# Patient Record
Sex: Female | Born: 1995 | Race: White | Hispanic: No | Marital: Single | Smoking: Former smoker
Health system: Southern US, Community
[De-identification: ages and names within clinical notes are randomized; demographics above are authoritative.]

## PROBLEM LIST (undated history)

## (undated) DIAGNOSIS — Z789 Other specified health status: Secondary | ICD-10-CM

## (undated) HISTORY — PX: NO PAST SURGERIES: SHX2092

---
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
## 2017-04-24 ENCOUNTER — Encounter (HOSPITAL_COMMUNITY): Payer: Self-pay | Admitting: Emergency Medicine

## 2017-04-24 ENCOUNTER — Emergency Department (HOSPITAL_COMMUNITY)
Admission: EM | Admit: 2017-04-24 | Discharge: 2017-04-24 | Disposition: A | Discharge status: Home / Self Care | Payer: Medicaid Other | Attending: Emergency Medicine | Admitting: Emergency Medicine

## 2017-04-24 DIAGNOSIS — O26859 Spotting complicating pregnancy, unspecified trimester: Secondary | ICD-10-CM | POA: Diagnosis not present

## 2017-04-24 DIAGNOSIS — Z5321 Procedure and treatment not carried out due to patient leaving prior to being seen by health care provider: Secondary | ICD-10-CM | POA: Diagnosis not present

## 2017-04-24 DIAGNOSIS — Z3A00 Weeks of gestation of pregnancy not specified: Secondary | ICD-10-CM | POA: Insufficient documentation

## 2017-04-24 LAB — BASIC METABOLIC PANEL
ANION GAP: 6 (ref 5–15)
BUN: 15 mg/dL (ref 6–20)
CO2: 27 mmol/L (ref 22–32)
Calcium: 8.9 mg/dL (ref 8.9–10.3)
Chloride: 105 mmol/L (ref 101–111)
Creatinine, Ser: 0.71 mg/dL (ref 0.44–1.00)
GFR calc Af Amer: >60 mL/min (ref >60)
GLUCOSE: 92 mg/dL (ref 65–99)
POTASSIUM: 4.1 mmol/L (ref 3.5–5.1)
Sodium: 138 mmol/L (ref 135–145)

## 2017-04-24 LAB — HCG, QUANTITATIVE, PREGNANCY: HCG, BETA CHAIN, QUANT, S: 9 m[IU]/mL — AB (ref <5)

## 2017-04-24 LAB — CBC WITH DIFFERENTIAL/PLATELET
Basophils Absolute: 0 10*3/uL (ref 0.0–0.1)
Basophils Relative: 0 %
EOS PCT: 4 %
Eosinophils Absolute: 0.3 10*3/uL (ref 0.0–0.7)
HCT: 40 % (ref 36.0–46.0)
HEMOGLOBIN: 13.3 g/dL (ref 12.0–15.0)
LYMPHS ABS: 2.6 10*3/uL (ref 0.7–4.0)
LYMPHS PCT: 37 %
MCH: 30.2 pg (ref 26.0–34.0)
MCHC: 33.3 g/dL (ref 30.0–36.0)
MCV: 90.9 fL (ref 78.0–100.0)
Monocytes Absolute: 0.5 10*3/uL (ref 0.1–1.0)
Monocytes Relative: 8 %
NEUTROS ABS: 3.5 10*3/uL (ref 1.7–7.7)
NEUTROS PCT: 51 %
PLATELETS: 296 10*3/uL (ref 150–400)
RBC: 4.4 MIL/uL (ref 3.87–5.11)
RDW: 12.6 % (ref 11.5–15.5)
WBC: 6.9 10*3/uL (ref 4.0–10.5)

## 2017-04-24 NOTE — ED Triage Notes (Signed)
Patient took a pregnancy test Saturday that was positive and started with vaginal bleeding on Sunday. States she went to [facility] yesterday and was told she is "most likely having a miscarriage."

## 2017-10-31 NOTE — L&D Delivery Note (Signed)
Delivery Note At  a viable female was delivered via  (Presentation:vertex ; LOA ).  APGAR:9 , 9; weight  .   Placenta status: spont,shultz .  Cord:3vc  with the following complications:none .  Cord pH: n/a  Anesthesia: epidural  Episiotomy:  none Lacerations:  none Suture Repair: n/a Est. Blood Loss 200(mL):    Mom to postpartum.  Baby to Couplet care / Skin to Skin.  Koren Shiver 07/01/2018, 9:21 AM

## 2017-12-04 ENCOUNTER — Ambulatory Visit (INDEPENDENT_AMBULATORY_CARE_PROVIDER_SITE_OTHER): Payer: Medicaid Other | Admitting: Adult Health

## 2017-12-04 ENCOUNTER — Encounter: Payer: Self-pay | Admitting: Adult Health

## 2017-12-04 VITALS — BP 90/60 | HR 61 | Ht 64.0 in | Wt 93.5 lb

## 2017-12-04 DIAGNOSIS — N926 Irregular menstruation, unspecified: Secondary | ICD-10-CM

## 2017-12-04 DIAGNOSIS — O3680X0 Pregnancy with inconclusive fetal viability, not applicable or unspecified: Secondary | ICD-10-CM | POA: Insufficient documentation

## 2017-12-04 DIAGNOSIS — Z3201 Encounter for pregnancy test, result positive: Secondary | ICD-10-CM

## 2017-12-04 DIAGNOSIS — R109 Unspecified abdominal pain: Secondary | ICD-10-CM | POA: Diagnosis not present

## 2017-12-04 DIAGNOSIS — Z3A08 8 weeks gestation of pregnancy: Secondary | ICD-10-CM

## 2017-12-04 LAB — POCT URINE PREGNANCY: PREG TEST UR: POSITIVE — AB

## 2017-12-04 NOTE — Progress Notes (Signed)
Subjective:     Patient ID: Elizabeth Bird, female   DOB: 1995/11/28, 22 y.o.   MRN: [id number]  HPI Elizabeth Bird is a 22 year old white female in for UPT, has missed a period and had 2+HPTs.Has some cramping.She had miscarriage in February 2017(about 14 weeks, in size), blood type A+.  Review of Systems +missed period with 2+HPTs +cramps, no bleeding Reviewed past medical,surgical, social and family history. Reviewed medications and allergies.     Objective:   Physical Exam BP 90/60 (BP Location: Left Arm, Patient Position: Sitting, Cuff Size: Normal)   Pulse 61   Ht 5\' 4"  (1.626 m)   Wt 93 lb 8 oz (42.4 kg)   LMP 10/08/2017 (Approximate)   BMI 16.05 kg/m UPT +, about 8+1 week by LMP with EDD 07/15/18.Skin warm and dry. Neck: mid line trachea, normal thyroid, good ROM, no lymphadenopathy noted. Lungs: clear to ausculation bilaterally. Cardiovascular: regular rate and rhythm.Abdomen is soft and non tender.PHQ 2 score 0. Discussed that babies delivered at [facility] and about after hours call service.     Assessment:     1. Pregnancy examination or test, positive result   2. [redacted] weeks gestation of pregnancy   3. Encounter to determine fetal viability of pregnancy, single or unspecified fetus       Plan:     Continue PNV Return in about 1 week for dating [place] Review handouts on First trimester and by [facility]  Request records from [facility]

## 2017-12-04 NOTE — Patient Instructions (Signed)
First Trimester of Pregnancy The first trimester of pregnancy is from week 1 until the end of week 13 (months 1 through 3). A week after a sperm fertilizes an egg, the egg will implant on the wall of the uterus. This embryo will begin to develop into a baby. Genes from you and your partner will form the baby. The female genes will determine whether the baby will be a boy or a girl. At 6-8 weeks, the eyes and face will be formed, and the heartbeat can be seen on ultrasound. At the end of 12 weeks, all the baby's organs will be formed. Now that you are pregnant, you will want to do everything you can to have a healthy baby. Two of the most important things are to get good prenatal care and to follow your health care provider's instructions. Prenatal care is all the medical care you receive before the baby's birth. This care will help prevent, find, and treat any problems during the pregnancy and childbirth. Body changes during your first trimester Your body goes through many changes during pregnancy. The changes vary from woman to woman.  You may gain or lose a couple of pounds at first.  You may feel sick to your stomach (nauseous) and you may throw up (vomit). If the vomiting is uncontrollable, call your health care provider.  You may tire easily.  You may develop headaches that can be relieved by medicines. All medicines should be approved by your health care provider.  You may urinate more often. Painful urination may mean you have a bladder infection.  You may develop heartburn as a result of your pregnancy.  You may develop constipation because certain hormones are causing the muscles that push stool through your intestines to slow down.  You may develop hemorrhoids or swollen veins (varicose veins).  Your breasts may begin to grow larger and become tender. Your nipples may stick out more, and the tissue that surrounds them (areola) may become darker.  Your gums may bleed and may be  sensitive to brushing and flossing.  Dark spots or blotches (chloasma, mask of pregnancy) may develop on your face. This will likely fade after the baby is born.  Your menstrual periods will stop.  You may have a loss of appetite.  You may develop cravings for certain kinds of food.  You may have changes in your emotions from day to day, such as being excited to be pregnant or being concerned that something may go wrong with the pregnancy and baby.  You may have more vivid and strange dreams.  You may have changes in your hair. These can include thickening of your hair, rapid growth, and changes in texture. Some women also have hair loss during or after pregnancy, or hair that feels dry or thin. Your hair will most likely return to normal after your baby is born.  What to expect at prenatal visits During a routine prenatal visit:  You will be weighed to make sure you and the baby are growing normally.  Your blood pressure will be taken.  Your abdomen will be measured to track your baby's growth.  The fetal heartbeat will be listened to between weeks 10 and 14 of your pregnancy.  Test results from any previous visits will be discussed.  Your health care provider may ask you:  How you are feeling.  If you are feeling the baby move.  If you have had any abnormal symptoms, such as leaking fluid, bleeding, severe headaches,   or abdominal cramping.  If you are using any tobacco products, including cigarettes, chewing tobacco, and electronic cigarettes.  If you have any questions.  Other tests that may be performed during your first trimester include:  Blood tests to find your blood type and to check for the presence of any previous infections. The tests will also be used to check for low iron levels (anemia) and protein on red blood cells (Rh antibodies). Depending on your risk factors, or if you previously had diabetes during pregnancy, you may have tests to check for high blood  sugar that affects pregnant women (gestational diabetes).  Urine tests to check for infections, diabetes, or protein in the urine.  An ultrasound to confirm the proper growth and development of the baby.  Fetal screens for spinal cord problems (spina bifida) and Down syndrome.  HIV (human immunodeficiency virus) testing. Routine prenatal testing includes screening for HIV, unless you choose not to have this test.  You may need other tests to make sure you and the baby are doing well.  Follow these instructions at home: Medicines  Follow your health care provider's instructions regarding medicine use. Specific medicines may be either safe or unsafe to take during pregnancy.  Take a prenatal vitamin that contains at least 600 micrograms (mcg) of folic acid.  If you develop constipation, try taking a stool softener if your health care provider approves. Eating and drinking  Eat a balanced diet that includes fresh fruits and vegetables, whole grains, good sources of protein such as meat, eggs, or tofu, and low-fat dairy. Your health care provider will help you determine the amount of weight gain that is right for you.  Avoid raw meat and uncooked cheese. These carry germs that can cause birth defects in the baby.  Eating four or five small meals rather than three large meals a day may help relieve nausea and vomiting. If you start to feel nauseous, eating a few soda crackers can be helpful. Drinking liquids between meals, instead of during meals, also seems to help ease nausea and vomiting.  Limit foods that are high in fat and processed sugars, such as fried and sweet foods.  To prevent constipation: ? Eat foods that are high in fiber, such as fresh fruits and vegetables, whole grains, and beans. ? Drink enough fluid to keep your urine clear or pale yellow. Activity  Exercise only as directed by your health care provider. Most women can continue their usual exercise routine during  pregnancy. Try to exercise for 30 minutes at least 5 days a week. Exercising will help you: ? Control your weight. ? Stay in shape. ? Be prepared for labor and delivery.  Experiencing pain or cramping in the lower abdomen or lower back is a good sign that you should stop exercising. Check with your health care provider before continuing with normal exercises.  Try to avoid standing for long periods of time. Move your legs often if you must stand in one place for a long time.  Avoid heavy lifting.  Wear low-heeled shoes and practice good posture.  You may continue to have sex unless your health care provider tells you not to. Relieving pain and discomfort  Wear a good support bra to relieve breast tenderness.  Take warm sitz baths to soothe any pain or discomfort caused by hemorrhoids. Use hemorrhoid cream if your health care provider approves.  Rest with your legs elevated if you have leg cramps or low back pain.  If you develop   varicose veins in your legs, wear support hose. Elevate your feet for 15 minutes, 3-4 times a day. Limit salt in your diet. Prenatal care  Schedule your prenatal visits by the twelfth week of pregnancy. They are usually scheduled monthly at first, then more often in the last 2 months before delivery.  Write down your questions. Take them to your prenatal visits.  Keep all your prenatal visits as told by your health care provider. This is important. Safety  Wear your seat belt at all times when driving.  Make a list of emergency phone numbers, including numbers for family, friends, the hospital, and police and fire departments. General instructions  Ask your health care provider for a referral to a local prenatal education class. Begin classes no later than the beginning of month 6 of your pregnancy.  Ask for help if you have counseling or nutritional needs during pregnancy. Your health care provider can offer advice or refer you to specialists for help  with various needs.  Do not use hot tubs, steam rooms, or saunas.  Do not douche or use tampons or scented sanitary pads.  Do not cross your legs for long periods of time.  Avoid cat litter boxes and soil used by cats. These carry germs that can cause birth defects in the baby and possibly loss of the fetus by miscarriage or stillbirth.  Avoid all smoking, herbs, alcohol, and medicines not prescribed by your health care provider. Chemicals in these products affect the formation and growth of the baby.  Do not use any products that contain nicotine or tobacco, such as cigarettes and e-cigarettes. If you need help quitting, ask your health care provider. You may receive counseling support and other resources to help you quit.  Schedule a dentist appointment. At home, brush your teeth with a soft toothbrush and be gentle when you floss. Contact a health care provider if:  You have dizziness.  You have mild pelvic cramps, pelvic pressure, or nagging pain in the abdominal area.  You have persistent nausea, vomiting, or diarrhea.  You have a bad smelling vaginal discharge.  You have pain when you urinate.  You notice increased swelling in your face, hands, legs, or ankles.  You are exposed to fifth disease or chickenpox.  You are exposed to [place] measles (rubella) and have never had it. Get help right away if:  You have a fever.  You are leaking fluid from your vagina.  You have spotting or bleeding from your vagina.  You have severe abdominal cramping or pain.  You have rapid weight gain or loss.  You vomit blood or material that looks like coffee grounds.  You develop a severe headache.  You have shortness of breath.  You have any kind of trauma, such as from a fall or a car accident. Summary  The first trimester of pregnancy is from week 1 until the end of week 13 (months 1 through 3).  Your body goes through many changes during pregnancy. The changes vary from  woman to woman.  You will have routine prenatal visits. During those visits, your health care provider will examine you, discuss any test results you may have, and talk with you about how you are feeling. This information is not intended to replace advice given to you by your health care provider. Make sure you discuss any questions you have with your health care provider. Document Released: 10/11/2001 Document Revised: 09/28/2016 Document Reviewed: 09/28/2016 [employer] Interactive Patient Education  2018 [employer]  

## 2017-12-07 ENCOUNTER — Ambulatory Visit (INDEPENDENT_AMBULATORY_CARE_PROVIDER_SITE_OTHER): Payer: Medicaid Other

## 2017-12-07 ENCOUNTER — Other Ambulatory Visit: Payer: Self-pay | Admitting: Adult Health

## 2017-12-07 DIAGNOSIS — N83202 Unspecified ovarian cyst, left side: Secondary | ICD-10-CM

## 2017-12-07 DIAGNOSIS — O3481 Maternal care for other abnormalities of pelvic organs, first trimester: Secondary | ICD-10-CM | POA: Diagnosis not present

## 2017-12-07 DIAGNOSIS — N83292 Other ovarian cyst, left side: Secondary | ICD-10-CM

## 2017-12-07 DIAGNOSIS — O3680X0 Pregnancy with inconclusive fetal viability, not applicable or unspecified: Secondary | ICD-10-CM

## 2017-12-07 DIAGNOSIS — Z3A08 8 weeks gestation of pregnancy: Secondary | ICD-10-CM | POA: Diagnosis not present

## 2017-12-07 NOTE — Progress Notes (Signed)
[place] 8+4 wks,single IUP w/ys,positive fht 180 bpm,normal right ovary,simple left ovarian cyst 5.8 x 3.5 x 3.1 cm w/arterial and venous flow

## 2017-12-20 ENCOUNTER — Encounter: Payer: Self-pay | Admitting: Women's Health

## 2017-12-20 ENCOUNTER — Ambulatory Visit (INDEPENDENT_AMBULATORY_CARE_PROVIDER_SITE_OTHER): Payer: Medicaid Other | Admitting: Women's Health

## 2017-12-20 ENCOUNTER — Ambulatory Visit: Payer: Medicaid Other | Admitting: *Deleted

## 2017-12-20 VITALS — BP 90/50 | HR 56 | Wt 95.0 lb

## 2017-12-20 DIAGNOSIS — Z3682 Encounter for antenatal screening for nuchal translucency: Secondary | ICD-10-CM | POA: Diagnosis not present

## 2017-12-20 DIAGNOSIS — R636 Underweight: Secondary | ICD-10-CM

## 2017-12-20 DIAGNOSIS — O09291 Supervision of pregnancy with other poor reproductive or obstetric history, first trimester: Secondary | ICD-10-CM | POA: Diagnosis not present

## 2017-12-20 DIAGNOSIS — O9989 Other specified diseases and conditions complicating pregnancy, childbirth and the puerperium: Secondary | ICD-10-CM

## 2017-12-20 DIAGNOSIS — Z3A10 10 weeks gestation of pregnancy: Secondary | ICD-10-CM

## 2017-12-20 DIAGNOSIS — Z1389 Encounter for screening for other disorder: Secondary | ICD-10-CM

## 2017-12-20 DIAGNOSIS — Z8759 Personal history of other complications of pregnancy, childbirth and the puerperium: Secondary | ICD-10-CM | POA: Insufficient documentation

## 2017-12-20 DIAGNOSIS — Z3490 Encounter for supervision of normal pregnancy, unspecified, unspecified trimester: Secondary | ICD-10-CM | POA: Insufficient documentation

## 2017-12-20 DIAGNOSIS — Z3481 Encounter for supervision of other normal pregnancy, first trimester: Secondary | ICD-10-CM

## 2017-12-20 DIAGNOSIS — Z331 Pregnant state, incidental: Secondary | ICD-10-CM

## 2017-12-20 LAB — POCT URINALYSIS DIPSTICK
Blood, UA: NEGATIVE
Glucose, UA: NEGATIVE
KETONES UA: NEGATIVE
Leukocytes, UA: NEGATIVE
NITRITE UA: NEGATIVE

## 2017-12-20 NOTE — Patient Instructions (Signed)
Elizabeth Bird, I greatly value your feedback.  If you receive a survey following your visit with [place] today, we appreciate you taking the time to fill it out.  Thanks, Knute Neu, CNM, WHNP-BC   Nausea & Vomiting  Have saltine crackers or pretzels by your bed and eat a few bites before you raise your head out of bed in the morning  Eat small frequent meals throughout the day instead of large meals  Drink plenty of fluids throughout the day to stay hydrated, just don't drink a lot of fluids with your meals.  This can make your stomach fill up faster making you feel sick  Do not brush your teeth right after you eat  Products with real ginger are good for nausea, like ginger ale and ginger hard candy Make sure it says made with real ginger!  Sucking on sour candy like lemon heads is also good for nausea  If your prenatal vitamins make you nauseated, take them at night so you will sleep through the nausea  Sea Bands  If you feel like you need medicine for the nausea & vomiting please let us know  If you are unable to keep any fluids or food down please let us know   Constipation  Drink plenty of fluid, preferably water, throughout the day  Eat foods high in fiber such as fruits, vegetables, and grains  Exercise, such as walking, is a good way to keep your bowels regular  Drink warm fluids, especially warm prune juice, or decaf coffee  Eat a 1/2 cup of real oatmeal (not instant), 1/2 cup applesauce, and 1/2-1 cup warm prune juice every day  If needed, you may take Colace (docusate sodium) stool softener once or twice a day to help keep the stool soft. If you are pregnant, wait until you are out of your first trimester (12-14 weeks of pregnancy)  If you still are having problems with constipation, you may take Miralax once daily as needed to help keep your bowels regular.  If you are pregnant, wait until you are out of your first trimester (12-14 weeks of pregnancy)   First  Trimester of Pregnancy The first trimester of pregnancy is from week 1 until the end of week 12 (months 1 through 3). A week after a sperm fertilizes an egg, the egg will implant on the wall of the uterus. This embryo will begin to develop into a baby. Genes from you and your partner are forming the baby. The female genes determine whether the baby is a boy or a girl. At 6-8 weeks, the eyes and face are formed, and the heartbeat can be seen on ultrasound. At the end of 12 weeks, all the baby's organs are formed.  Now that you are pregnant, you will want to do everything you can to have a healthy baby. Two of the most important things are to get good prenatal care and to follow your health care provider's instructions. Prenatal care is all the medical care you receive before the baby's birth. This care will help prevent, find, and treat any problems during the pregnancy and childbirth. BODY CHANGES Your body goes through many changes during pregnancy. The changes vary from woman to woman.   You may gain or lose a couple of pounds at first.  You may feel sick to your stomach (nauseous) and throw up (vomit). If the vomiting is uncontrollable, call your health care provider.  You may tire easily.  You may develop headaches that  can be relieved by medicines approved by your health care provider.  You may urinate more often. Painful urination may mean you have a bladder infection.  You may develop heartburn as a result of your pregnancy.  You may develop constipation because certain hormones are causing the muscles that push waste through your intestines to slow down.  You may develop hemorrhoids or swollen, bulging veins (varicose veins).  Your breasts may begin to grow larger and become tender. Your nipples may stick out more, and the tissue that surrounds them (areola) may become darker.  Your gums may bleed and may be sensitive to brushing and flossing.  Dark spots or blotches (chloasma, mask  of pregnancy) may develop on your face. This will likely fade after the baby is born.  Your menstrual periods will stop.  You may have a loss of appetite.  You may develop cravings for certain kinds of food.  You may have changes in your emotions from day to day, such as being excited to be pregnant or being concerned that something may go wrong with the pregnancy and baby.  You may have more vivid and strange dreams.  You may have changes in your hair. These can include thickening of your hair, rapid growth, and changes in texture. Some women also have hair loss during or after pregnancy, or hair that feels dry or thin. Your hair will most likely return to normal after your baby is born. WHAT TO EXPECT AT YOUR PRENATAL VISITS During a routine prenatal visit:  You will be weighed to make sure you and the baby are growing normally.  Your blood pressure will be taken.  Your abdomen will be measured to track your baby's growth.  The fetal heartbeat will be listened to starting around week 10 or 12 of your pregnancy.  Test results from any previous visits will be discussed. Your health care provider may ask you:  How you are feeling.  If you are feeling the baby move.  If you have had any abnormal symptoms, such as leaking fluid, bleeding, severe headaches, or abdominal cramping.  If you have any questions. Other tests that may be performed during your first trimester include:  Blood tests to find your blood type and to check for the presence of any previous infections. They will also be used to check for low iron levels (anemia) and Rh antibodies. Later in the pregnancy, blood tests for diabetes will be done along with other tests if problems develop.  Urine tests to check for infections, diabetes, or protein in the urine.  An ultrasound to confirm the proper growth and development of the baby.  An amniocentesis to check for possible genetic problems.  Fetal screens for spina  bifida and Down syndrome.  You may need other tests to make sure you and the baby are doing well. HOME CARE INSTRUCTIONS  Medicines  Follow your health care provider's instructions regarding medicine use. Specific medicines may be either safe or unsafe to take during pregnancy.  Take your prenatal vitamins as directed.  If you develop constipation, try taking a stool softener if your health care provider approves. Diet  Eat regular, well-balanced meals. Choose a variety of foods, such as meat or vegetable-based protein, fish, milk and low-fat dairy products, vegetables, fruits, and whole grain breads and cereals. Your health care provider will help you determine the amount of weight gain that is right for you.  Avoid raw meat and uncooked cheese. These carry germs that can cause  birth defects in the baby.  Eating four or five small meals rather than three large meals a day may help relieve nausea and vomiting. If you start to feel nauseous, eating a few soda crackers can be helpful. Drinking liquids between meals instead of during meals also seems to help nausea and vomiting.  If you develop constipation, eat more high-fiber foods, such as fresh vegetables or fruit and whole grains. Drink enough fluids to keep your urine clear or pale yellow. Activity and Exercise  Exercise only as directed by your health care provider. Exercising will help you:  Control your weight.  Stay in shape.  Be prepared for labor and delivery.  Experiencing pain or cramping in the lower abdomen or low back is a good sign that you should stop exercising. Check with your health care provider before continuing normal exercises.  Try to avoid standing for long periods of time. Move your legs often if you must stand in one place for a long time.  Avoid heavy lifting.  Wear low-heeled shoes, and practice good posture.  You may continue to have sex unless your health care provider directs you  otherwise. Relief of Pain or Discomfort  Wear a good support bra for breast tenderness.   Take warm sitz baths to soothe any pain or discomfort caused by hemorrhoids. Use hemorrhoid cream if your health care provider approves.   Rest with your legs elevated if you have leg cramps or low back pain.  If you develop varicose veins in your legs, wear support hose. Elevate your feet for 15 minutes, 3-4 times a day. Limit salt in your diet. Prenatal Care  Schedule your prenatal visits by the twelfth week of pregnancy. They are usually scheduled monthly at first, then more often in the last 2 months before delivery.  Write down your questions. Take them to your prenatal visits.  Keep all your prenatal visits as directed by your health care provider. Safety  Wear your seat belt at all times when driving.  Make a list of emergency phone numbers, including numbers for family, friends, the hospital, and police and fire departments. General Tips  Ask your health care provider for a referral to a local prenatal education class. Begin classes no later than at the beginning of month 6 of your pregnancy.  Ask for help if you have counseling or nutritional needs during pregnancy. Your health care provider can offer advice or refer you to specialists for help with various needs.  Do not use hot tubs, steam rooms, or saunas.  Do not douche or use tampons or scented sanitary pads.  Do not cross your legs for long periods of time.  Avoid cat litter boxes and soil used by cats. These carry germs that can cause birth defects in the baby and possibly loss of the fetus by miscarriage or stillbirth.  Avoid all smoking, herbs, alcohol, and medicines not prescribed by your health care provider. Chemicals in these affect the formation and growth of the baby.  Schedule a dentist appointment. At home, brush your teeth with a soft toothbrush and be gentle when you floss. SEEK MEDICAL CARE IF:   You have  dizziness.  You have mild pelvic cramps, pelvic pressure, or nagging pain in the abdominal area.  You have persistent nausea, vomiting, or diarrhea.  You have a bad smelling vaginal discharge.  You have pain with urination.  You notice increased swelling in your face, hands, legs, or ankles. SEEK IMMEDIATE MEDICAL CARE IF:  You have a fever.  You are leaking fluid from your vagina.  You have spotting or bleeding from your vagina.  You have severe abdominal cramping or pain.  You have rapid weight gain or loss.  You vomit blood or material that looks like coffee grounds.  You are exposed to [place] measles and have never had them.  You are exposed to fifth disease or chickenpox.  You develop a severe headache.  You have shortness of breath.  You have any kind of trauma, such as from a fall or a car accident. Document Released: 10/11/2001 Document Revised: 03/03/2014 Document Reviewed: 08/27/2013 [facility] Patient Information 2015 [place], [place]. This information is not intended to replace advice given to you by your health care provider. Make sure you discuss any questions you have with your health care provider.

## 2017-12-20 NOTE — Progress Notes (Signed)
INITIAL OBSTETRICAL VISIT Patient name: Cristian Grieves MRN [id number]  Date of birth: 1995/12/17 Chief Complaint:   Initial Prenatal Visit  History of Present Illness:   Ghalia Reicks is a 22 y.o. G32P0020 Caucasian female at [redacted]w[redacted]d by LMP c/w 9wk u/s, with an Estimated Date of Delivery: 07/15/18 being seen today for her initial obstetrical visit.   Her obstetrical history is significant for early SAB x 1, then 16wk SAB- went to [facility] ED w/ abd pain dx w/ UTI had 'normal u/s', then went to [facility] for 2nd opinion-felt LOF while there, was transferred to [facility] and u/s revealed anhydramnios w/ head through cx, delivered shortly thereafter.  Placenta pathology revealed acute chorionitis.  Today she reports mild n/v- declines meds.  Patient's last menstrual period was 10/08/2017 (approximate). Last pap <21yo. Results were: n/a Review of Systems:   Pertinent items are noted in HPI Denies cramping/contractions, leakage of fluid, vaginal bleeding, abnormal vaginal discharge w/ itching/odor/irritation, headaches, visual changes, shortness of breath, chest pain, abdominal pain, severe nausea/vomiting, or problems with urination or bowel movements unless otherwise stated above.  Pertinent History Reviewed:  Reviewed past medical,surgical, social, obstetrical and family history.  Reviewed problem list, medications and allergies. OB History  Gravida Para Term Preterm AB Living  3 1     1  0  SAB TAB Ectopic Multiple Live Births  1     0      # Outcome Date GA Lbr Len/2nd Weight Sex Delivery Anes PTL Lv  3 Current           2 Para 12/06/15 [redacted]w[redacted]d  3.4 oz (0.096 kg) F Vag-Spont None  FD     Complications: Preterm premature rupture of membranes  1 SAB              Physical Assessment:   Vitals:   12/20/17 1502  BP: (!) 90/50  Pulse: (!) 56  Weight: 95 lb (43.1 kg)  Body mass index is 16.31 kg/m.       Physical Examination:  General appearance - well appearing, and in no distress  Mental status -  alert, oriented to person, place, and time  Psych:  She has a normal mood and affect  Skin - warm and dry, normal color, no suspicious lesions noted  Chest - effort normal, all lung fields clear to auscultation bilaterally  Heart - normal rate and regular rhythm  Abdomen - soft, nontender  Extremities:  No swelling or varicosities noted  Thin prep pap is not done  Fetal Heart Rate (bpm): 166 via doppler  Results for orders placed or performed in visit on 12/20/17 (from the past 24 hour(s))  POCT urinalysis dipstick   Collection Time: 12/20/17  3:02 PM  Result Value Ref Range   Color, UA     Clarity, UA     Glucose, UA neg    Bilirubin, UA     Ketones, UA neg    Spec Grav, UA  1.010 - 1.025   Blood, UA neg    pH, UA  5.0 - 8.0   Protein, UA 1+    Urobilinogen, UA  0.2 or 1.0 E.U./dL   Nitrite, UA neg    Leukocytes, UA Negative Negative   Appearance     Odor      Assessment & Plan:  1) Low-Risk Pregnancy G3P0010 at [redacted]w[redacted]d with an Estimated Date of Delivery: 07/15/18   2) Initial OB visit  3) H/O 16wk loss> d/t labor/ROM/acute chorionitis  4) Underweight> pre-gravid  BMI 15.3  Meds: No orders of the defined types were placed in this encounter.   Initial labs obtained Continue prenatal vitamins Reviewed n/v relief measures and warning s/s to report Reviewed recommended weight gain based on pre-gravid BMI Encouraged well-balanced diet Genetic Screening discussed Integrated Screen: requested Cystic fibrosis screening discussed requested Ultrasound discussed; fetal survey: requested CCNC completed>PCM not here  Follow-up: Return in about 3 weeks (around 01/08/2018) for LROB, US:NT+1stIT.   Orders Placed This Encounter  Procedures  . GC/Chlamydia Probe Amp  . Urine Culture  . US Fetal Nuchal Translucency Measurement  . Obstetric Panel, Including HIV  . Urinalysis, Routine w reflex microscopic  . Pain Management Screening Profile (10S)  . Cystic Fibrosis Mutation 97    . POCT urinalysis dipstick    Roma Schanz CNM, [facility] 12/20/2017 3:45 PM

## 2017-12-21 LAB — GC/CHLAMYDIA PROBE AMP
Chlamydia trachomatis, NAA: NEGATIVE
Neisseria gonorrhoeae by PCR: NEGATIVE

## 2017-12-21 LAB — PMP SCREEN PROFILE (10S), URINE
Amphetamine Scrn, Ur: NEGATIVE ng/mL
BARBITURATE SCREEN URINE: NEGATIVE ng/mL
BENZODIAZEPINE SCREEN, URINE: NEGATIVE ng/mL
CANNABINOIDS UR QL SCN: NEGATIVE ng/mL
COCAINE(METAB.)SCREEN, URINE: NEGATIVE ng/mL
CREATININE(CRT), U: 308.6 mg/dL — AB (ref 20.0–300.0)
Methadone Screen, Urine: NEGATIVE ng/mL
OPIATE SCREEN URINE: NEGATIVE ng/mL
OXYCODONE+OXYMORPHONE UR QL SCN: NEGATIVE ng/mL
Ph of Urine: 5.4 (ref 4.5–8.9)
Phencyclidine Qn, Ur: NEGATIVE ng/mL
Propoxyphene Scrn, Ur: NEGATIVE ng/mL

## 2017-12-21 LAB — URINALYSIS, ROUTINE W REFLEX MICROSCOPIC
BILIRUBIN UA: NEGATIVE
GLUCOSE, UA: NEGATIVE
Nitrite, UA: NEGATIVE
RBC UA: NEGATIVE
Urobilinogen, Ur: 1 mg/dL (ref 0.2–1.0)
pH, UA: 5.5 (ref 5.0–7.5)

## 2017-12-21 LAB — MICROSCOPIC EXAMINATION
BACTERIA UA: NONE SEEN
Casts: NONE SEEN /lpf

## 2017-12-21 LAB — OBSTETRIC PANEL, INCLUDING HIV
ANTIBODY SCREEN: NEGATIVE
BASOS ABS: 0 10*3/uL (ref 0.0–0.2)
BASOS: 0 %
EOS (ABSOLUTE): 0.2 10*3/uL (ref 0.0–0.4)
Eos: 2 %
HEMATOCRIT: 36.1 % (ref 34.0–46.6)
HIV SCREEN 4TH GENERATION: NONREACTIVE
Hemoglobin: 12.1 g/dL (ref 11.1–15.9)
Hepatitis B Surface Ag: NEGATIVE
Immature Grans (Abs): 0 10*3/uL (ref 0.0–0.1)
Immature Granulocytes: 0 %
LYMPHS ABS: 2.9 10*3/uL (ref 0.7–3.1)
Lymphs: 25 %
MCH: 30 pg (ref 26.6–33.0)
MCHC: 33.5 g/dL (ref 31.5–35.7)
MCV: 90 fL (ref 79–97)
MONOCYTES: 8 %
Monocytes Absolute: 0.9 10*3/uL (ref 0.1–0.9)
NEUTROS ABS: 7.6 10*3/uL — AB (ref 1.4–7.0)
Neutrophils: 65 %
PLATELETS: 280 10*3/uL (ref 150–379)
RBC: 4.03 x10E6/uL (ref 3.77–5.28)
RDW: 13.7 % (ref 12.3–15.4)
RPR Ser Ql: NONREACTIVE
RUBELLA: 4.24 {index} (ref >0.99)
Rh Factor: POSITIVE
WBC: 11.6 10*3/uL — ABNORMAL HIGH (ref 3.4–10.8)

## 2017-12-22 LAB — URINE CULTURE: Organism ID, Bacteria: NO GROWTH

## 2017-12-27 LAB — CYSTIC FIBROSIS MUTATION 97: Interpretation: NOT DETECTED

## 2018-01-10 ENCOUNTER — Other Ambulatory Visit: Payer: Self-pay

## 2018-01-10 ENCOUNTER — Encounter: Payer: Self-pay | Admitting: Obstetrics and Gynecology

## 2018-01-10 ENCOUNTER — Ambulatory Visit (INDEPENDENT_AMBULATORY_CARE_PROVIDER_SITE_OTHER): Payer: Medicaid Other

## 2018-01-10 ENCOUNTER — Ambulatory Visit (INDEPENDENT_AMBULATORY_CARE_PROVIDER_SITE_OTHER): Payer: Medicaid Other | Admitting: Obstetrics and Gynecology

## 2018-01-10 VITALS — BP 86/54 | HR 64 | Wt 98.6 lb

## 2018-01-10 DIAGNOSIS — R636 Underweight: Secondary | ICD-10-CM

## 2018-01-10 DIAGNOSIS — Z3682 Encounter for antenatal screening for nuchal translucency: Secondary | ICD-10-CM

## 2018-01-10 DIAGNOSIS — Z3481 Encounter for supervision of other normal pregnancy, first trimester: Secondary | ICD-10-CM

## 2018-01-10 DIAGNOSIS — Z1389 Encounter for screening for other disorder: Secondary | ICD-10-CM

## 2018-01-10 DIAGNOSIS — Z331 Pregnant state, incidental: Secondary | ICD-10-CM

## 2018-01-10 DIAGNOSIS — O09291 Supervision of pregnancy with other poor reproductive or obstetric history, first trimester: Secondary | ICD-10-CM

## 2018-01-10 DIAGNOSIS — Z3A13 13 weeks gestation of pregnancy: Secondary | ICD-10-CM

## 2018-01-10 DIAGNOSIS — O9989 Other specified diseases and conditions complicating pregnancy, childbirth and the puerperium: Secondary | ICD-10-CM

## 2018-01-10 LAB — POCT URINALYSIS DIPSTICK
Glucose, UA: NEGATIVE
Ketones, UA: NEGATIVE
LEUKOCYTES UA: NEGATIVE
NITRITE UA: NEGATIVE
PROTEIN UA: NEGATIVE
RBC UA: NEGATIVE

## 2018-01-10 NOTE — Patient Instructions (Signed)
(  [phone] is the phone number for Pregnancy Classes or hospital tours at [facility].  ° °You will be referred to  [email or web address]   for more information on childbirth classes   °At this site you may register for classes. You may sign up for a waiting list if classes are full. Please SIGN UP FOR THIS!.   When the waiting list becomes long, sometimes new classes can be added. ° ° ° °

## 2018-01-10 NOTE — Progress Notes (Signed)
LOW-RISK PREGNANCY VISIT Patient name: Elizabeth Bird MRN [id number]  Date of birth: Oct 22, 1996 Chief Complaint:   Routine Prenatal Visit (ultrasound/ NT/ IT)  History of Present Illness:   Elizabeth Bird is a 22 y.o. G70P0010 female at [redacted]w[redacted]d with an Estimated Date of Delivery: 07/15/18 being seen today for ongoing management of a low-risk pregnancy.  Her obstetrical history is significant for early SAB x 1, then 16wk SAB- went to [facility] ED w/ abd pain dx w/ UTI had 'normal u/s', then went to [facility] for 2nd opinion-felt LOF while there, was transferred to [facility] and u/s revealed anhydramnios w/ head through cx, delivered shortly thereafter.  Placenta pathology revealed acute chorionitis  Today she reports no complaints. She has gained 5 pounds. She is accompanied by the FOB.  . Vag. Bleeding: None.   . denies leaking of fluid. Review of Systems:   Pertinent items are noted in HPI Denies abnormal vaginal discharge w/ itching/odor/irritation, headaches, visual changes, shortness of breath, chest pain, abdominal pain, severe nausea/vomiting, or problems with urination or bowel movements unless otherwise stated above. Pertinent History Reviewed:  Reviewed past medical,surgical, social, obstetrical and family history.  Reviewed problem list, medications and allergies. Physical Assessment:   Vitals:   01/10/18 1414  BP: (!) 86/54  Pulse: 64  Weight: 98 lb 9.6 oz (44.7 kg)  Body mass index is 16.92 kg/m.        Physical Examination:   General appearance: Well appearing, and in no distress  Mental status: Alert, oriented to person, place, and time  Skin: Warm & dry  Cardiovascular: Normal heart rate noted  Respiratory: Normal respiratory effort, no distress  Abdomen: Soft, gravid, nontender  Pelvic: Cervical exam deferred         Extremities: Edema: None  Fetal Status:          Results for orders placed or performed in visit on 01/10/18 (from the past 24 hour(s))  POCT urinalysis dipstick   Collection Time: 01/10/18  2:16 PM  Result Value Ref Range   Color, UA     Clarity, UA     Glucose, UA neg    Bilirubin, UA     Ketones, UA neg    Spec Grav, UA  1.010 - 1.025   Blood, UA neg    pH, UA  5.0 - 8.0   Protein, UA neg    Urobilinogen, UA  0.2 or 1.0 E.U./dL   Nitrite, UA neg    Leukocytes, UA Negative Negative   Appearance     Odor      Assessment & Plan:  1) Low-risk pregnancy G3P0010 at [redacted]w[redacted]d with an Estimated Date of Delivery: 07/15/18   2) H/O 16wk loss> d/t labor/ROM/acute chorionitis  3) Underweight> pre-gravid BMI 15.3   Meds: No orders of the defined types were placed in this encounter.  Labs/procedures today: [place] 13+3 wks,measurement c/w dates,crl 77.42 mm,NT 1.5 mm,NB present,fhr 152 bpm,posterior pl gr 0,normal ovaries bilat  Plan:  Continue routine obstetrical care   Reviewed: Preterm labor symptoms and general obstetric precautions including but not limited to vaginal bleeding, contractions, leaking of fluid and fetal movement were reviewed in detail with the patient.  All questions were answered  Follow-up: Return in about 4 weeks (around 02/07/2018), or if symptoms worsen or fail to improve, for LROB, Labs: 2nd IT.  Orders Placed This Encounter  Procedures  . Integrated 1  . POCT urinalysis dipstick    By signing my name below, I, Jabier Gauss, attest  that this documentation has been prepared under the direction and in the presence of Jonnie Kind, MD. Electronically Signed: Jabier Gauss, Medical Scribe. 01/10/18. 2:31 PM.  I personally performed the services described in this documentation, which was SCRIBED in my presence. The recorded information has been reviewed and considered accurate. It has been edited as necessary during review. Jonnie Kind, MD

## 2018-01-10 NOTE — Progress Notes (Signed)
[place] 13+3 wks,measurement c/w dates,crl 77.42 mm,NT 1.5 mm,NB present,fhr 152 bpm,posterior pl gr 0,normal ovaries bilat

## 2018-01-14 LAB — INTEGRATED 1
CROWN RUMP LENGTH MAT SCREEN: 77.4 mm
Gest. Age on Collection Date: 13.6 weeks
Maternal Age at EDD: 21.2 yr
Nuchal Translucency (NT): 1.5 mm
Number of Fetuses: 1
PAPP-A Value: 5068.3 ng/mL
Weight: 99 [lb_av]

## 2018-02-07 ENCOUNTER — Ambulatory Visit (INDEPENDENT_AMBULATORY_CARE_PROVIDER_SITE_OTHER): Payer: Medicaid Other | Admitting: Advanced Practice Midwife

## 2018-02-07 VITALS — BP 98/60 | HR 68 | Wt 101.0 lb

## 2018-02-07 DIAGNOSIS — Z3482 Encounter for supervision of other normal pregnancy, second trimester: Secondary | ICD-10-CM

## 2018-02-07 DIAGNOSIS — Z331 Pregnant state, incidental: Secondary | ICD-10-CM

## 2018-02-07 DIAGNOSIS — Z1389 Encounter for screening for other disorder: Secondary | ICD-10-CM

## 2018-02-07 DIAGNOSIS — Z1379 Encounter for other screening for genetic and chromosomal anomalies: Secondary | ICD-10-CM

## 2018-02-07 DIAGNOSIS — Z3A17 17 weeks gestation of pregnancy: Secondary | ICD-10-CM

## 2018-02-07 DIAGNOSIS — Z363 Encounter for antenatal screening for malformations: Secondary | ICD-10-CM

## 2018-02-07 LAB — POCT URINALYSIS DIPSTICK
Glucose, UA: NEGATIVE
Ketones, UA: NEGATIVE
LEUKOCYTES UA: NEGATIVE
Nitrite, UA: NEGATIVE
Protein, UA: NEGATIVE
RBC UA: NEGATIVE

## 2018-02-07 NOTE — Patient Instructions (Signed)
Elizabeth Bird, I greatly value your feedback.  If you receive a survey following your visit with [place] today, we appreciate you taking the time to fill it out.  Thanks, Elizabeth Bird, CNM     Second Trimester of Pregnancy The second trimester is from week 14 through week 27 (months 4 through 6). The second trimester is often a time when you feel your best. Your body has adjusted to being pregnant, and you begin to feel better physically. Usually, morning sickness has lessened or quit completely, you may have more energy, and you may have an increase in appetite. The second trimester is also a time when the fetus is growing rapidly. At the end of the sixth month, the fetus is about 9 inches long and weighs about 1 pounds. You will likely begin to feel the baby move (quickening) between 16 and 20 weeks of pregnancy. Body changes during your second trimester Your body continues to go through many changes during your second trimester. The changes vary from woman to woman.  Your weight will continue to increase. You will notice your lower abdomen bulging out.  You may begin to get stretch marks on your hips, abdomen, and breasts.  You may develop headaches that can be relieved by medicines. The medicines should be approved by your health care provider.  You may urinate more often because the fetus is pressing on your bladder.  You may develop or continue to have heartburn as a result of your pregnancy.  You may develop constipation because certain hormones are causing the muscles that push waste through your intestines to slow down.  You may develop hemorrhoids or swollen, bulging veins (varicose veins).  You may have back pain. This is caused by: ? Weight gain. ? Pregnancy hormones that are relaxing the joints in your pelvis. ? A shift in weight and the muscles that support your balance.  Your breasts will continue to grow and they will continue to become tender.  Your gums may  bleed and may be sensitive to brushing and flossing.  Dark spots or blotches (chloasma, mask of pregnancy) may develop on your face. This will likely fade after the baby is born.  A dark line from your belly button to the pubic area (linea nigra) may appear. This will likely fade after the baby is born.  You may have changes in your hair. These can include thickening of your hair, rapid growth, and changes in texture. Some women also have hair loss during or after pregnancy, or hair that feels dry or thin. Your hair will most likely return to normal after your baby is born.  What to expect at prenatal visits During a routine prenatal visit:  You will be weighed to make sure you and the fetus are growing normally.  Your blood pressure will be taken.  Your abdomen will be measured to track your baby's growth.  The fetal heartbeat will be listened to.  Any test results from the previous visit will be discussed.  Your health care provider may ask you:  How you are feeling.  If you are feeling the baby move.  If you have had any abnormal symptoms, such as leaking fluid, bleeding, severe headaches, or abdominal cramping.  If you are using any tobacco products, including cigarettes, chewing tobacco, and electronic cigarettes.  If you have any questions.  Other tests that may be performed during your second trimester include:  Blood tests that check for: ? Low iron levels (anemia). ? High  blood sugar that affects pregnant women (gestational diabetes) between 42 and 28 weeks. ? Rh antibodies. This is to check for a protein on red blood cells (Rh factor).  Urine tests to check for infections, diabetes, or protein in the urine.  An ultrasound to confirm the proper growth and development of the baby.  An amniocentesis to check for possible genetic problems.  Fetal screens for spina bifida and Down syndrome.  HIV (human immunodeficiency virus) testing. Routine prenatal testing  includes screening for HIV, unless you choose not to have this test.  Follow these instructions at home: Medicines  Follow your health care provider's instructions regarding medicine use. Specific medicines may be either safe or unsafe to take during pregnancy.  Take a prenatal vitamin that contains at least 600 micrograms (mcg) of folic acid.  If you develop constipation, try taking a stool softener if your health care provider approves. Eating and drinking  Eat a balanced diet that includes fresh fruits and vegetables, whole grains, good sources of protein such as meat, eggs, or tofu, and low-fat dairy. Your health care provider will help you determine the amount of weight gain that is right for you.  Avoid raw meat and uncooked cheese. These carry germs that can cause birth defects in the baby.  If you have low calcium intake from food, talk to your health care provider about whether you should take a daily calcium supplement.  Limit foods that are high in fat and processed sugars, such as fried and sweet foods.  To prevent constipation: ? Drink enough fluid to keep your urine clear or pale yellow. ? Eat foods that are high in fiber, such as fresh fruits and vegetables, whole grains, and beans. Activity  Exercise only as directed by your health care provider. Most women can continue their usual exercise routine during pregnancy. Try to exercise for 30 minutes at least 5 days a week. Stop exercising if you experience uterine contractions.  Avoid heavy lifting, wear low heel shoes, and practice good posture.  A sexual relationship may be continued unless your health care provider directs you otherwise. Relieving pain and discomfort  Wear a good support bra to prevent discomfort from breast tenderness.  Take warm sitz baths to soothe any pain or discomfort caused by hemorrhoids. Use hemorrhoid cream if your health care provider approves.  Rest with your legs elevated if you have  leg cramps or low back pain.  If you develop varicose veins, wear support hose. Elevate your feet for 15 minutes, 3-4 times a day. Limit salt in your diet. Prenatal Care  Write down your questions. Take them to your prenatal visits.  Keep all your prenatal visits as told by your health care provider. This is important. Safety  Wear your seat belt at all times when driving.  Make a list of emergency phone numbers, including numbers for family, friends, the hospital, and police and fire departments. General instructions  Ask your health care provider for a referral to a local prenatal education class. Begin classes no later than the beginning of month 6 of your pregnancy.  Ask for help if you have counseling or nutritional needs during pregnancy. Your health care provider can offer advice or refer you to specialists for help with various needs.  Do not use hot tubs, steam rooms, or saunas.  Do not douche or use tampons or scented sanitary pads.  Do not cross your legs for long periods of time.  Avoid cat litter boxes and soil  used by cats. These carry germs that can cause birth defects in the baby and possibly loss of the fetus by miscarriage or stillbirth.  Avoid all smoking, herbs, alcohol, and unprescribed drugs. Chemicals in these products can affect the formation and growth of the baby.  Do not use any products that contain nicotine or tobacco, such as cigarettes and e-cigarettes. If you need help quitting, ask your health care provider.  Visit your dentist if you have not gone yet during your pregnancy. Use a soft toothbrush to brush your teeth and be gentle when you floss. Contact a health care provider if:  You have dizziness.  You have mild pelvic cramps, pelvic pressure, or nagging pain in the abdominal area.  You have persistent nausea, vomiting, or diarrhea.  You have a bad smelling vaginal discharge.  You have pain when you urinate. Get help right away if:  You  have a fever.  You are leaking fluid from your vagina.  You have spotting or bleeding from your vagina.  You have severe abdominal cramping or pain.  You have rapid weight gain or weight loss.  You have shortness of breath with chest pain.  You notice sudden or extreme swelling of your face, hands, ankles, feet, or legs.  You have not felt your baby move in over an hour.  You have severe headaches that do not go away when you take medicine.  You have vision changes. Summary  The second trimester is from week 14 through week 27 (months 4 through 6). It is also a time when the fetus is growing rapidly.  Your body goes through many changes during pregnancy. The changes vary from woman to woman.  Avoid all smoking, herbs, alcohol, and unprescribed drugs. These chemicals affect the formation and growth your baby.  Do not use any tobacco products, such as cigarettes, chewing tobacco, and e-cigarettes. If you need help quitting, ask your health care provider.  Contact your health care provider if you have any questions. Keep all prenatal visits as told by your health care provider. This is important. This information is not intended to replace advice given to you by your health care provider. Make sure you discuss any questions you have with your health care provider.      CHILDBIRTH CLASSES [phone] is the phone number for Pregnancy Classes or hospital tours at [facility] will be referred to  [email or web address] for more information on childbirth classes  At this site you may register for classes. You may sign up for a waiting list if classes are full. Please SIGN UP FOR THIS!.   When the waiting list becomes long, sometimes new classes can be added.

## 2018-02-07 NOTE — Progress Notes (Signed)
  G3P0010 [redacted]w[redacted]d Estimated Date of Delivery: 07/15/18  Blood pressure 98/60, pulse 68, weight 101 lb (45.8 kg), last menstrual period 10/08/2017.   BP weight and urine results all reviewed and noted.  Please refer to the obstetrical flow sheet for the fundal height and fetal heart rate documentation:  Patient reports good fetal movement, denies any bleeding and no rupture of membranes symptoms or regular contractions. Patient is without complaints. All questions were answered.   Physical Assessment:   Vitals:   02/07/18 1341  BP: 98/60  Pulse: 68  Weight: 101 lb (45.8 kg)  Body mass index is 17.34 kg/m.        Physical Examination:   General appearance: Well appearing, and in no distress  Mental status: Alert, oriented to person, place, and time  Skin: Warm & dry  Cardiovascular: Normal heart rate noted  Respiratory: Normal respiratory effort, no distress  Abdomen: Soft, gravid, nontender  Pelvic: Cervical exam deferred         Extremities: Edema: None  Fetal Status: Fetal Heart Rate (bpm): 147   Movement: Present    Results for orders placed or performed in visit on 02/07/18 (from the past 24 hour(s))  POCT Urinalysis Dipstick   Collection Time: 02/07/18  1:44 PM  Result Value Ref Range   Color, UA     Clarity, UA     Glucose, UA neg    Bilirubin, UA     Ketones, UA neg    Spec Grav, UA  1.010 - 1.025   Blood, UA neg    pH, UA  5.0 - 8.0   Protein, UA neg    Urobilinogen, UA  0.2 or 1.0 E.U./dL   Nitrite, UA neg    Leukocytes, UA Negative Negative   Appearance     Odor       Orders Placed This Encounter  Procedures  . US OB Comp + 14 Wk  . INTEGRATED 2  . POCT Urinalysis Dipstick    Plan:  Continued routine obstetrical care,   Return for [place] monday or after Anatomy only; 4 weeks for LROB.

## 2018-02-09 LAB — INTEGRATED 2
AFP MoM: 0.96
Alpha-Fetoprotein: 45 ng/mL
Crown Rump Length: 77.4 mm
DIA MOM: 0.73
DIA VALUE: 160.8 pg/mL
ESTRIOL UNCONJUGATED: 2.68 ng/mL
Gest. Age on Collection Date: 13.6 weeks
Gestational Age: 17.6 weeks
Maternal Age at EDD: 21.2 yr
NUCHAL TRANSLUCENCY (NT): 1.5 mm
NUCHAL TRANSLUCENCY MOM: 0.83
NUMBER OF FETUSES: 1
PAPP-A MoM: 2.2
PAPP-A Value: 5068.3 ng/mL
Test Results:: NEGATIVE
WEIGHT: 99 [lb_av]
Weight: 99 [lb_av]
hCG MoM: 0.7
hCG Value: 21.8 IU/mL
uE3 MoM: 2.22

## 2018-02-14 ENCOUNTER — Ambulatory Visit (INDEPENDENT_AMBULATORY_CARE_PROVIDER_SITE_OTHER): Payer: Medicaid Other

## 2018-02-14 DIAGNOSIS — Z363 Encounter for antenatal screening for malformations: Secondary | ICD-10-CM

## 2018-02-14 DIAGNOSIS — Z3402 Encounter for supervision of normal first pregnancy, second trimester: Secondary | ICD-10-CM

## 2018-02-14 NOTE — Progress Notes (Signed)
[place] 18+3 wks,breech,posterior pl gr 0,normal ovaries bilat,svp of fluid 3.6 cm,fhr 145 bpm,cx 3.1 cm,EFW 279 g,anatomy complete,no obvious abnormalities

## 2018-03-07 ENCOUNTER — Encounter: Payer: Self-pay | Admitting: Advanced Practice Midwife

## 2018-03-07 ENCOUNTER — Ambulatory Visit (INDEPENDENT_AMBULATORY_CARE_PROVIDER_SITE_OTHER): Payer: Medicaid Other | Admitting: Advanced Practice Midwife

## 2018-03-07 VITALS — BP 90/50 | HR 78 | Wt 109.4 lb

## 2018-03-07 DIAGNOSIS — Z3482 Encounter for supervision of other normal pregnancy, second trimester: Secondary | ICD-10-CM

## 2018-03-07 DIAGNOSIS — Z3A21 21 weeks gestation of pregnancy: Secondary | ICD-10-CM

## 2018-03-07 DIAGNOSIS — Z331 Pregnant state, incidental: Secondary | ICD-10-CM

## 2018-03-07 DIAGNOSIS — Z1389 Encounter for screening for other disorder: Secondary | ICD-10-CM

## 2018-03-07 LAB — POCT URINALYSIS DIPSTICK
Blood, UA: NEGATIVE
Glucose, UA: NEGATIVE
KETONES UA: NEGATIVE
Leukocytes, UA: NEGATIVE
NITRITE UA: NEGATIVE
PROTEIN UA: NEGATIVE

## 2018-03-07 NOTE — Patient Instructions (Signed)
Elizabeth Bird, I greatly value your feedback.  If you receive a survey following your visit with [place] today, we appreciate you taking the time to fill it out.  Thanks, Elizabeth Bird, CNM     Second Trimester of Pregnancy The second trimester is from week 14 through week 27 (months 4 through 6). The second trimester is often a time when you feel your best. Your body has adjusted to being pregnant, and you begin to feel better physically. Usually, morning sickness has lessened or quit completely, you may have more energy, and you may have an increase in appetite. The second trimester is also a time when the fetus is growing rapidly. At the end of the sixth month, the fetus is about 9 inches long and weighs about 1 pounds. You will likely begin to feel the baby move (quickening) between 16 and 20 weeks of pregnancy. Body changes during your second trimester Your body continues to go through many changes during your second trimester. The changes vary from woman to woman.  Your weight will continue to increase. You will notice your lower abdomen bulging out.  You may begin to get stretch marks on your hips, abdomen, and breasts.  You may develop headaches that can be relieved by medicines. The medicines should be approved by your health care provider.  You may urinate more often because the fetus is pressing on your bladder.  You may develop or continue to have heartburn as a result of your pregnancy.  You may develop constipation because certain hormones are causing the muscles that push waste through your intestines to slow down.  You may develop hemorrhoids or swollen, bulging veins (varicose veins).  You may have back pain. This is caused by: ? Weight gain. ? Pregnancy hormones that are relaxing the joints in your pelvis. ? A shift in weight and the muscles that support your balance.  Your breasts will continue to grow and they will continue to become tender.  Your gums may  bleed and may be sensitive to brushing and flossing.  Dark spots or blotches (chloasma, mask of pregnancy) may develop on your face. This will likely fade after the baby is born.  A dark line from your belly button to the pubic area (linea nigra) may appear. This will likely fade after the baby is born.  You may have changes in your hair. These can include thickening of your hair, rapid growth, and changes in texture. Some women also have hair loss during or after pregnancy, or hair that feels dry or thin. Your hair will most likely return to normal after your baby is born.  What to expect at prenatal visits During a routine prenatal visit:  You will be weighed to make sure you and the fetus are growing normally.  Your blood pressure will be taken.  Your abdomen will be measured to track your baby's growth.  The fetal heartbeat will be listened to.  Any test results from the previous visit will be discussed.  Your health care provider may ask you:  How you are feeling.  If you are feeling the baby move.  If you have had any abnormal symptoms, such as leaking fluid, bleeding, severe headaches, or abdominal cramping.  If you are using any tobacco products, including cigarettes, chewing tobacco, and electronic cigarettes.  If you have any questions.  Other tests that may be performed during your second trimester include:  Blood tests that check for: ? Low iron levels (anemia). ? High  blood sugar that affects pregnant women (gestational diabetes) between 42 and 28 weeks. ? Rh antibodies. This is to check for a protein on red blood cells (Rh factor).  Urine tests to check for infections, diabetes, or protein in the urine.  An ultrasound to confirm the proper growth and development of the baby.  An amniocentesis to check for possible genetic problems.  Fetal screens for spina bifida and Down syndrome.  HIV (human immunodeficiency virus) testing. Routine prenatal testing  includes screening for HIV, unless you choose not to have this test.  Follow these instructions at home: Medicines  Follow your health care provider's instructions regarding medicine use. Specific medicines may be either safe or unsafe to take during pregnancy.  Take a prenatal vitamin that contains at least 600 micrograms (mcg) of folic acid.  If you develop constipation, try taking a stool softener if your health care provider approves. Eating and drinking  Eat a balanced diet that includes fresh fruits and vegetables, whole grains, good sources of protein such as meat, eggs, or tofu, and low-fat dairy. Your health care provider will help you determine the amount of weight gain that is right for you.  Avoid raw meat and uncooked cheese. These carry germs that can cause birth defects in the baby.  If you have low calcium intake from food, talk to your health care provider about whether you should take a daily calcium supplement.  Limit foods that are high in fat and processed sugars, such as fried and sweet foods.  To prevent constipation: ? Drink enough fluid to keep your urine clear or pale yellow. ? Eat foods that are high in fiber, such as fresh fruits and vegetables, whole grains, and beans. Activity  Exercise only as directed by your health care provider. Most women can continue their usual exercise routine during pregnancy. Try to exercise for 30 minutes at least 5 days a week. Stop exercising if you experience uterine contractions.  Avoid heavy lifting, wear low heel shoes, and practice good posture.  A sexual relationship may be continued unless your health care provider directs you otherwise. Relieving pain and discomfort  Wear a good support bra to prevent discomfort from breast tenderness.  Take warm sitz baths to soothe any pain or discomfort caused by hemorrhoids. Use hemorrhoid cream if your health care provider approves.  Rest with your legs elevated if you have  leg cramps or low back pain.  If you develop varicose veins, wear support hose. Elevate your feet for 15 minutes, 3-4 times a day. Limit salt in your diet. Prenatal Care  Write down your questions. Take them to your prenatal visits.  Keep all your prenatal visits as told by your health care provider. This is important. Safety  Wear your seat belt at all times when driving.  Make a list of emergency phone numbers, including numbers for family, friends, the hospital, and police and fire departments. General instructions  Ask your health care provider for a referral to a local prenatal education class. Begin classes no later than the beginning of month 6 of your pregnancy.  Ask for help if you have counseling or nutritional needs during pregnancy. Your health care provider can offer advice or refer you to specialists for help with various needs.  Do not use hot tubs, steam rooms, or saunas.  Do not douche or use tampons or scented sanitary pads.  Do not cross your legs for long periods of time.  Avoid cat litter boxes and soil  used by cats. These carry germs that can cause birth defects in the baby and possibly loss of the fetus by miscarriage or stillbirth.  Avoid all smoking, herbs, alcohol, and unprescribed drugs. Chemicals in these products can affect the formation and growth of the baby.  Do not use any products that contain nicotine or tobacco, such as cigarettes and e-cigarettes. If you need help quitting, ask your health care provider.  Visit your dentist if you have not gone yet during your pregnancy. Use a soft toothbrush to brush your teeth and be gentle when you floss. Contact a health care provider if:  You have dizziness.  You have mild pelvic cramps, pelvic pressure, or nagging pain in the abdominal area.  You have persistent nausea, vomiting, or diarrhea.  You have a bad smelling vaginal discharge.  You have pain when you urinate. Get help right away if:  You  have a fever.  You are leaking fluid from your vagina.  You have spotting or bleeding from your vagina.  You have severe abdominal cramping or pain.  You have rapid weight gain or weight loss.  You have shortness of breath with chest pain.  You notice sudden or extreme swelling of your face, hands, ankles, feet, or legs.  You have not felt your baby move in over an hour.  You have severe headaches that do not go away when you take medicine.  You have vision changes. Summary  The second trimester is from week 14 through week 27 (months 4 through 6). It is also a time when the fetus is growing rapidly.  Your body goes through many changes during pregnancy. The changes vary from woman to woman.  Avoid all smoking, herbs, alcohol, and unprescribed drugs. These chemicals affect the formation and growth your baby.  Do not use any tobacco products, such as cigarettes, chewing tobacco, and e-cigarettes. If you need help quitting, ask your health care provider.  Contact your health care provider if you have any questions. Keep all prenatal visits as told by your health care provider. This is important. This information is not intended to replace advice given to you by your health care provider. Make sure you discuss any questions you have with your health care provider.      CHILDBIRTH CLASSES [phone] is the phone number for Pregnancy Classes or hospital tours at [facility] will be referred to  [email or web address] for more information on childbirth classes  At this site you may register for classes. You may sign up for a waiting list if classes are full. Please SIGN UP FOR THIS!.   When the waiting list becomes long, sometimes new classes can be added.

## 2018-03-07 NOTE — Progress Notes (Signed)
  G3P0010 [redacted]w[redacted]d Estimated Date of Delivery: 07/15/18  Blood pressure (!) 90/50, pulse 78, weight 109 lb 6.4 oz (49.6 kg), last menstrual period 10/08/2017.   BP weight and urine results all reviewed and noted.  Please refer to the obstetrical flow sheet for the fundal height and fetal heart rate documentation:  Patient reports good fetal movement, denies any bleeding and no rupture of membranes symptoms or regular contractions. Patient is without complaints. All questions were answered.   Physical Assessment:   Vitals:   03/07/18 1520  BP: (!) 90/50  Pulse: 78  Weight: 109 lb 6.4 oz (49.6 kg)  Body mass index is 18.78 kg/m.        Physical Examination:   General appearance: Well appearing, and in no distress  Mental status: Alert, oriented to person, place, and time  Skin: Warm & dry  Cardiovascular: Normal heart rate noted  Respiratory: Normal respiratory effort, no distress  Abdomen: Soft, gravid, nontender  Pelvic: Cervical exam deferred         Extremities: Edema: None  Fetal Status: Fetal Heart Rate (bpm): 148   Movement: Present    Results for orders placed or performed in visit on 03/07/18 (from the past 24 hour(s))  POCT urinalysis dipstick   Collection Time: 03/07/18  3:25 PM  Result Value Ref Range   Color, UA     Clarity, UA     Glucose, UA neg    Bilirubin, UA     Ketones, UA neg    Spec Grav, UA  1.010 - 1.025   Blood, UA neg    pH, UA  5.0 - 8.0   Protein, UA neg    Urobilinogen, UA  0.2 or 1.0 E.U./dL   Nitrite, UA neg    Leukocytes, UA Negative Negative   Appearance     Odor       Orders Placed This Encounter  Procedures  . POCT urinalysis dipstick    Plan:  Continued routine obstetrical care,   Return in about 1 month (around 04/04/2018) for LROB.

## 2018-03-31 ENCOUNTER — Encounter (HOSPITAL_COMMUNITY): Payer: Self-pay | Admitting: Emergency Medicine

## 2018-03-31 ENCOUNTER — Emergency Department (HOSPITAL_COMMUNITY)
Admission: EM | Admit: 2018-03-31 | Discharge: 2018-03-31 | Disposition: A | Discharge status: Home / Self Care | Payer: Medicaid Other | Attending: Emergency Medicine | Admitting: Emergency Medicine

## 2018-03-31 ENCOUNTER — Other Ambulatory Visit: Payer: Self-pay

## 2018-03-31 DIAGNOSIS — R1084 Generalized abdominal pain: Secondary | ICD-10-CM | POA: Diagnosis not present

## 2018-03-31 DIAGNOSIS — Z3A24 24 weeks gestation of pregnancy: Secondary | ICD-10-CM

## 2018-03-31 DIAGNOSIS — N3000 Acute cystitis without hematuria: Secondary | ICD-10-CM

## 2018-03-31 DIAGNOSIS — O2340 Unspecified infection of urinary tract in pregnancy, unspecified trimester: Secondary | ICD-10-CM | POA: Diagnosis not present

## 2018-03-31 DIAGNOSIS — Z87891 Personal history of nicotine dependence: Secondary | ICD-10-CM | POA: Insufficient documentation

## 2018-03-31 DIAGNOSIS — O9989 Other specified diseases and conditions complicating pregnancy, childbirth and the puerperium: Secondary | ICD-10-CM | POA: Diagnosis not present

## 2018-03-31 LAB — URINALYSIS, ROUTINE W REFLEX MICROSCOPIC
Bilirubin Urine: NEGATIVE
Glucose, UA: NEGATIVE mg/dL
Hgb urine dipstick: NEGATIVE
KETONES UR: NEGATIVE mg/dL
Nitrite: NEGATIVE
PH: 8 (ref 5.0–8.0)
PROTEIN: NEGATIVE mg/dL
Specific Gravity, Urine: 1.011 (ref 1.005–1.030)

## 2018-03-31 MED ORDER — CEPHALEXIN 500 MG PO CAPS: 500.0000 mg | ORAL_CAPSULE | Freq: Two times a day (BID) | ORAL | 0 refills | Status: DC

## 2018-03-31 NOTE — Progress Notes (Signed)
Dr. Glo Herring made aware of patient and reviewed EFM tacing. Dr Glo Herring called ED Provider and plan discussed. Patient OB cleared.

## 2018-03-31 NOTE — ED Triage Notes (Signed)
Pt states that she has been having lower abdominal pain that started yesterday. Pt states that she is 6 months pregnant. Pt states that she has had an increase in vaginal discharge today.

## 2018-03-31 NOTE — ED Notes (Signed)
Ob rapid response notified.

## 2018-03-31 NOTE — ED Provider Notes (Signed)
[facility] EMERGENCY DEPARTMENT Provider Note   CSN: [id number] Arrival date & time: 03/31/18  0325     History   Chief Complaint Chief Complaint  Patient presents with  . Abdominal Pain    6 months pregnant   Pt seen with NP student, I performed history/physical/documentation  HPI Elizabeth Bird is a 22 y.o. female.  The history is provided by the patient.  Abdominal Pain   This is a recurrent problem. The current episode started more than 1 week ago. The problem occurs daily. The problem has been gradually worsening. The pain is located in the LLQ and RLQ. The pain is moderate. Pertinent negatives include fever, diarrhea, nausea, vomiting and dysuria. Exacerbated by: walking. Relieved by: rest.  Patient is currently [redacted] weeks pregnant presents with abdominal pain.  She reports she has had lower abdominal pain for weeks.  However during the day she was walking a lot with her family and she feels this worsened her pain.  No falls.  No trauma.  No vaginal bleeding.  Minimal discharge that is minimally changed.  No loss of fluid. No previous abdominal surgery. She had no complications with this pregnancy She came tonight to be reassured that nothing serious was going on with her baby  Past Medical History:  Diagnosis Date  . Medical history non-contributory     Patient Active Problem List   Diagnosis Date Noted  . Supervision of normal pregnancy 12/20/2017  . Underweight 12/20/2017  . History of miscarriage 12/20/2017    Past Surgical History:  Procedure Laterality Date  . NO PAST SURGERIES       OB History    Gravida  3   Para  1   Term      Preterm      AB  1   Living  0     SAB  1   TAB      Ectopic      Multiple  0   Live Births               Home Medications    Prior to Admission medications   Medication Sig Start Date End Date Taking? Authorizing Provider  Prenatal Vit-Fe Fumarate-FA (PRENATAL MULTIVITAMIN) TABS tablet Take 1 tablet by  mouth daily at 12 noon.    [provider]    Family History Family History  Problem Relation Age of Onset  . Multiple sclerosis Maternal Grandmother   . Arthritis Maternal Grandmother   . Suicidality Maternal Grandfather   . Multiple sclerosis Mother   . Thyroid disease Sister   . Schizophrenia Sister   . Bipolar disorder Sister   . Post-traumatic stress disorder Sister   . Asthma Maternal Aunt   . Cancer Maternal Aunt        colon    Social History Social History   Tobacco Use  . Smoking status: Former Smoker    Years: 7.00    Types: Cigarettes  . Smokeless tobacco: Never Used  Substance Use Topics  . Alcohol use: No    Frequency: Never    Comment: occasionally; not now  . Drug use: No    Types: Marijuana    Comment: LAST use was Sept 2016     Allergies   Mosquito (diagnostic) and Peroxide [hydrogen peroxide]   Review of Systems Review of Systems  Constitutional: Negative for fever.  Gastrointestinal: Positive for abdominal pain. Negative for diarrhea, nausea and vomiting.  Genitourinary: Negative for dysuria and vaginal bleeding.  All other systems reviewed and are negative.    Physical Exam Updated Vital Signs BP 116/78 (BP Location: Right Arm)   Pulse 98   Temp 98.1 F (36.7 C) (Oral)   Resp 17   Wt 52.6 kg (116 lb)   LMP 10/08/2017 (Approximate)   SpO2 100%   BMI 19.91 kg/m   Physical Exam CONSTITUTIONAL: Well-developed, no acute distress HEAD: Normocephalic/atraumatic EYES: EOMI ENMT: Mucous membranes moist NECK: supple no meningeal signs SPINE/BACK:entire spine nontender CV: S1/S2 noted, no murmurs/rubs/gallops noted LUNGS: Lungs are clear to auscultation bilaterally, no apparent distress ABDOMEN: soft, gravid, no focal tenderness, bowel sounds noted GU:no cva tenderness, cervical os closed, no fetal products, no vag bleeding, no fluid noted  nurse Brook present for exam NEURO: Pt is awake/alert/appropriate, moves all  extremitiesx4.  No facial droop.   EXTREMITIES: pulses normal/equal, full ROM SKIN: warm, color normal PSYCH: no abnormalities of mood noted, alert and oriented to situation   ED Treatments / Results  Labs (all labs ordered are listed, but only abnormal results are displayed) Labs Reviewed  URINALYSIS, ROUTINE W REFLEX MICROSCOPIC - Abnormal; Notable for the following components:      Result Value   APPearance CLOUDY (*)    Leukocytes, UA SMALL (*)    Bacteria, UA RARE (*)    All other components within normal limits  URINE CULTURE    EKG None  Radiology No results found.  Procedures Procedures (including critical care time)  Medications Ordered in ED Medications - No data to display   Initial Impression / Assessment and Plan / ED Course  I have reviewed the triage vital signs and the nursing notes.  Pertinent labs  results that were available during my care of the patient were reviewed by me and considered in my medical decision making (see chart for details).     4:23 AM Patient admits to having abdominal pain for several weeks, feels like it is worsening today.  She is in no distress.  No fevers or vomiting.  No focal tenderness on exam. I discussed the case with Dr. Glo Herring with OB/GYN.  She has had her monitoring via [facility], fetal heart tones are appropriate no signs of fetal distress. We will plan to check urinalysis, and will do pelvic exam to ensure there is no cervical dilation 5:35 AM Patient stable.  No distress.  Will treat for UTI.  Advise follow-up with OBGYN Final Clinical Impressions(s) / ED Diagnoses   Final diagnoses:  Generalized abdominal pain  [redacted] weeks gestation of pregnancy  Acute cystitis without hematuria    ED Discharge Orders        Ordered    cephALEXin (KEFLEX) 500 MG capsule  2 times daily     03/31/18 Starr Lake, MD 03/31/18 [phone]

## 2018-03-31 NOTE — Progress Notes (Signed)
Tcf: ED RN for patient G3P0, [redacted]w[redacted]d gestation wih abd pain and increased vagin discharge. EFM started by ED RN, will monitor remotely.

## 2018-03-31 NOTE — ED Notes (Signed)
Per ob rapid response pt it clear by ob.

## 2018-03-31 NOTE — Discharge Instructions (Addendum)

## 2018-04-01 LAB — URINE CULTURE: CULTURE: NO GROWTH

## 2018-04-04 ENCOUNTER — Ambulatory Visit (INDEPENDENT_AMBULATORY_CARE_PROVIDER_SITE_OTHER): Payer: Medicaid Other | Admitting: Advanced Practice Midwife

## 2018-04-04 VITALS — BP 113/71 | HR 88 | Wt 117.5 lb

## 2018-04-04 DIAGNOSIS — Z3482 Encounter for supervision of other normal pregnancy, second trimester: Secondary | ICD-10-CM

## 2018-04-04 DIAGNOSIS — Z3A25 25 weeks gestation of pregnancy: Secondary | ICD-10-CM

## 2018-04-04 NOTE — Patient Instructions (Signed)

## 2018-04-04 NOTE — Progress Notes (Signed)
  G3P0010 [redacted]w[redacted]d Estimated Date of Delivery: 07/15/18  Blood pressure 113/71, pulse 88, weight 117 lb 8 oz (53.3 kg), last menstrual period 10/08/2017.   BP weight and urine results all reviewed and noted.  Please refer to the obstetrical flow sheet for the fundal height and fetal heart rate documentation:  Patient reports good fetal movement, denies any bleeding and no rupture of membranes symptoms or regular contractions. Patient went to ED 6/1 with lower pelvic pain (sounds like RL) txd for UTI. No growth on urine cx.  Today feels better.   All questions were answered.    Physical Assessment:   Vitals:   04/04/18 1334  BP: 113/71  Pulse: 88  Weight: 117 lb 8 oz (53.3 kg)  Body mass index is 20.17 kg/m.        Physical Examination:   General appearance: Well appearing, and in no distress  Mental status: Alert, oriented to person, place, and time  Skin: Warm & dry  Cardiovascular: Normal heart rate noted  Respiratory: Normal respiratory effort, no distress  Abdomen: Soft, gravid, nontender  Pelvic: Cervical exam deferred         Extremities: Edema: None  Fetal Status:     Movement: Present    No results found for this or any previous visit (from the past 24 hour(s)).   No orders of the defined types were placed in this encounter.   Plan:  Continued routine obstetrical care,   Return in about 3 weeks (around 04/25/2018) for PN2/LROB.

## 2018-04-26 ENCOUNTER — Other Ambulatory Visit: Payer: Medicaid Other

## 2018-04-26 ENCOUNTER — Ambulatory Visit (INDEPENDENT_AMBULATORY_CARE_PROVIDER_SITE_OTHER): Payer: Medicaid Other | Admitting: Obstetrics and Gynecology

## 2018-04-26 VITALS — BP 110/66 | HR 92 | Wt 119.8 lb

## 2018-04-26 DIAGNOSIS — Z331 Pregnant state, incidental: Secondary | ICD-10-CM

## 2018-04-26 DIAGNOSIS — Z3483 Encounter for supervision of other normal pregnancy, third trimester: Secondary | ICD-10-CM

## 2018-04-26 DIAGNOSIS — Z131 Encounter for screening for diabetes mellitus: Secondary | ICD-10-CM

## 2018-04-26 DIAGNOSIS — Z1389 Encounter for screening for other disorder: Secondary | ICD-10-CM

## 2018-04-26 DIAGNOSIS — Z3A28 28 weeks gestation of pregnancy: Secondary | ICD-10-CM

## 2018-04-26 LAB — POCT URINALYSIS DIPSTICK
GLUCOSE UA: NEGATIVE
Ketones, UA: NEGATIVE
LEUKOCYTES UA: NEGATIVE
NITRITE UA: NEGATIVE
Protein, UA: NEGATIVE
RBC UA: NEGATIVE

## 2018-04-26 NOTE — Progress Notes (Signed)
G3P0010  Estimated Date of Delivery: 07/15/18 LROB [redacted]w[redacted]d History of PPROM,  Chief Complaint  Patient presents with  . Routine Prenatal Visit  ____  Patient complaints:none. Patient reports  None  good fetal movement,                           denies any bleeding , rupture of membranes,or regular contractions.  Blood pressure 110/66, pulse 92, weight 119 lb 12.8 oz (54.3 kg), last menstrual period 10/08/2017.   Urine results:to void before leaving refer to the ob flow sheet for FH and FHR, ,                          Physical Examination: General appearance - alert, well appearing, and in no distress and normal appearing weight                                      Abdomen - FH 29 ,                                                         -FHR 152                                                         Gravid c/w dates                                      Pelvic -                                             Questions were answered. Assessment: LROB G3P0010 @ [redacted]w[redacted]d lrob  Plan:  Continued routine obstetrical care, awaiting urine  F/u in 4 weeks for lrob

## 2018-04-26 NOTE — Patient Instructions (Signed)
[place] Call to Register: [phone] or [phone] or Register Online: [email or web address]  THESE CLASSES FILL UP VERY QUICKLY, SO SIGN UP AS SOON AS YOU CAN!!!  *Please visit [facility]'s pregnancy website at [email or web address]*  Option 1: Birth & Baby Series . This series of 3 weekly classes helps you and your labor partner prepare for childbirth at [place] newborn care, labor & birth, pain management, and comfort techniques . [facility] is included. . Cost: $60 per couple for insured or self-pay, $30 per couple for Medicaid  Option 2: Weekend Birth & Baby . This class is a weekend version of our Birth & Baby series. It is designed for parents who have a difficult time fitting several weeks of classes into their schedule.  . [facility] is included.  . Friday 6:30pm-8:30pm, Saturday 9am-4pm . Cost: $75 per couple for insured or self-pay, $30 per couple for Medicaid  Option 3: Natural Childbirth . This series of 5 weekly classes is for expectant parents who want to learn and practice natural methods of coping with the process of labor and childbirth. . [place] [facility] is included. . Cost: $75 per couple for insured or self-pay, $30 per couple for Medicaid  Option 4: Online Birth & Baby . This online class offers you the freedom to complete a Birth & Baby series in the comfort of your own home. The flexibility of this option allows you to review sections at your own place, at times convenient to you and your support people.  . Cost: $60 for 60 days of online access    Other Available Classes Baby & Me Enjoy this time to discuss newborn & infant parenting topics and family adjustment issues with other new mothers in a relaxed environment. Each week brings a new speaker or baby-centered activity. We encourage  mothers and their babies (birth to crawling) to join [place] every Thursday in the [place] at 11:00 am. You are welcome to visit this group even if you haven't delivered yet! It's wonderful to make new friends early and watch other moms interact with their babies. No registration or fee.   Big Brother/ Big Sister Let your children share in the joy of a new brother or sister in this special class designed just for them. This class is designed for children ages 2-6, but any age is welcome. Please register each child individually.  Breastfeeding Support Group This group is a mother-to-mother support circle where moms have the opportunity to share their breastfeeding experiences. A breastfeeding Support nurse is present for questions and concerns. Meets each Tuesday at 11:00 am. No fee or registration.  Breastfeeding Your Baby Breastfeeding is a special time for mother and child. This class will help you feel ready to begin this important relationship. Your partner is encouraged to attend with you.   Caring For Baby This class is for expectant  and adoptive parents who want to learn and practice the most up-to-date newborn care for their babies. Register only the mom-to-be and your partner can come with you. (Note: This class is included in the Birth & Baby series and the Weekend Birth & Baby classes.)  Comfort Techniques & Tour This 2-hour interactive class will provide you the opportunity to learn & practice hands-on techniques with your partner that can help relieve some discomfort of labor and encourage your baby to rotate toward  the best position for birth. A tour of the [facility] is included.   Daddy [employer] This course offers Dads-to-be the tools and knowledge needed to feel confident on their journey to becoming new fathers.  Grandparent Love Expecting a grandbaby? Learn about the latest infant care and safety recommendation and ways to  support your own child as he or she transitions into the parenting role.   Infant and Child CPR Parents, grandparents, babysitters, and friends learn Cardio-Pulmonary Resuscitation skills for infants and children. Register each participant individually. (Note: This Family & Friends program does not offer certification.)  Marvelous Multiples Expecting twins, triplets, or more? This class covers the differences in labor, birth, parenting, and breastfeeding issues that face multiples' parents. NICU tour is included.   Mom Talk This mom-led group offers support and connection to mothers as they journey through the adjustments and struggles of that sometimes overwhelming first year after the birth of a child. A member of our [facility] staff will be present to share resources and additional support if needed, as you care for yourself and baby. You are welcome to visit the group before you deliver! It's wonderful to meet new friends early and watch other moms interact with their babies. It's held at [facility] at 10:00 am each Tuesday morning and 6:00 pm each Thursday evening. Babies (birth to crawling) welcome. No registration or fee.   Waterbirth Classes Interested in a waterbirth? This informational class will help you discover whether waterbirth is the right fir for you.   [place] a virtual tour of [place] tours are available for participants of childbirth education classes.

## 2018-04-27 LAB — RPR: RPR: NONREACTIVE

## 2018-04-27 LAB — CBC
HEMOGLOBIN: 11 g/dL — AB (ref 11.1–15.9)
Hematocrit: 32.9 % — ABNORMAL LOW (ref 34.0–46.6)
MCH: 31.5 pg (ref 26.6–33.0)
MCHC: 33.4 g/dL (ref 31.5–35.7)
MCV: 94 fL (ref 79–97)
PLATELETS: 326 10*3/uL (ref 150–450)
RBC: 3.49 x10E6/uL — AB (ref 3.77–5.28)
RDW: 13.3 % (ref 12.3–15.4)
WBC: 13.1 10*3/uL — ABNORMAL HIGH (ref 3.4–10.8)

## 2018-04-27 LAB — GLUCOSE TOLERANCE, 2 HOURS W/ 1HR
GLUCOSE, 2 HOUR: 118 mg/dL (ref 65–152)
Glucose, 1 hour: 120 mg/dL (ref 65–179)
Glucose, Fasting: 74 mg/dL (ref 65–91)

## 2018-04-27 LAB — ANTIBODY SCREEN: Antibody Screen: NEGATIVE

## 2018-04-27 LAB — HIV ANTIBODY (ROUTINE TESTING W REFLEX): HIV Screen 4th Generation wRfx: NONREACTIVE

## 2018-05-23 ENCOUNTER — Encounter: Payer: Self-pay | Admitting: Advanced Practice Midwife

## 2018-05-23 ENCOUNTER — Ambulatory Visit (INDEPENDENT_AMBULATORY_CARE_PROVIDER_SITE_OTHER): Payer: Medicaid Other | Admitting: Advanced Practice Midwife

## 2018-05-23 VITALS — BP 98/64 | HR 88 | Wt 126.0 lb

## 2018-05-23 DIAGNOSIS — Z3483 Encounter for supervision of other normal pregnancy, third trimester: Secondary | ICD-10-CM

## 2018-05-23 DIAGNOSIS — O26843 Uterine size-date discrepancy, third trimester: Secondary | ICD-10-CM

## 2018-05-23 NOTE — Progress Notes (Addendum)
  G3P0010 108w3d Estimated Date of Delivery: 07/15/18  Blood pressure 98/64, pulse 88, weight 126 lb (57.2 kg), last menstrual period 10/08/2017.   BP weight and urine results all reviewed and noted.  Please refer to the obstetrical flow sheet for the fundal height and fetal heart rate documentation: Size<dates  Patient reports good fetal movement, denies any bleeding and no rupture of membranes symptoms or regular contractions. Patient is without complaints. All questions were answered.   Physical Assessment:   Vitals:   05/23/18 1515  BP: 98/64  Pulse: 88  Weight: 126 lb (57.2 kg)  Body mass index is 21.63 kg/m.        Physical Examination:   General appearance: Well appearing, and in no distress  Mental status: Alert, oriented to person, place, and time  Skin: Warm & dry  Cardiovascular: Normal heart rate noted  Respiratory: Normal respiratory effort, no distress  Abdomen: Soft, gravid, nontender  Pelvic: Cervical exam deferred         Extremities: Edema: None  Fetal Status:     Movement: Present    No results found for this or any previous visit (from the past 24 hour(s)).   Orders Placed This Encounter  Procedures  . US OB Follow Up    Plan:  Continued routine obstetrical care, Tdap today  Return for ASAP for EFW/AFI/LROB.

## 2018-05-23 NOTE — Patient Instructions (Addendum)
Elizabeth Bird, I greatly value your feedback.  If you receive a survey following your visit with [place] today, we appreciate you taking the time to fill it out.  Thanks, Elizabeth Bird, CNM   Call the office [phone]) or go to [facility] if:  You begin to have strong, frequent contractions  Your water breaks.  Sometimes it is a big gush of fluid, sometimes it is just a trickle that keeps getting your panties wet or running down your legs  You have vaginal bleeding.  It is normal to have a small amount of spotting if your cervix was checked.   You don't feel your baby moving like normal.  If you don't, get you something to eat and drink and lay down and focus on feeling your baby move.  You should feel at least 10 movements in 2 hours.  If you don't, you should call the office or go to [facility].    Tdap Vaccine  It is recommended that you get the Tdap vaccine during the third trimester of EACH pregnancy to help protect your baby from getting pertussis (whooping cough)  27-36 weeks is the BEST time to do this so that you can pass the protection on to your baby. During pregnancy is better than after pregnancy, but if you are unable to get it during pregnancy it will be offered at the hospital.   You will be offered this vaccine in the office after 27 weeks. If you do not have health insurance, you can get this vaccine at the health department or your family doctor  Everyone who will be around your baby should also be up-to-date on their vaccines. Adults (who are not pregnant) only need 1 dose of Tdap during adulthood.   Third Trimester of Pregnancy The third trimester is from week 29 through week 42, months 7 through 9. The third trimester is a time when the fetus is growing rapidly. At the end of the ninth month, the fetus is about 20 inches in length and weighs 6-10 pounds.  BODY CHANGES Your body goes through many changes during pregnancy. The changes vary from woman to  woman.   Your weight will continue to increase. You can expect to gain 25-35 pounds (11-16 kg) by the end of the pregnancy.  You may begin to get stretch marks on your hips, abdomen, and breasts.  You may urinate more often because the fetus is moving lower into your pelvis and pressing on your bladder.  You may develop or continue to have heartburn as a result of your pregnancy.  You may develop constipation because certain hormones are causing the muscles that push waste through your intestines to slow down.  You may develop hemorrhoids or swollen, bulging veins (varicose veins).  You may have pelvic pain because of the weight gain and pregnancy hormones relaxing your joints between the bones in your pelvis. Backaches may result from overexertion of the muscles supporting your posture.  You may have changes in your hair. These can include thickening of your hair, rapid growth, and changes in texture. Some women also have hair loss during or after pregnancy, or hair that feels dry or thin. Your hair will most likely return to normal after your baby is born.  Your breasts will continue to grow and be tender. A yellow discharge may leak from your breasts called colostrum.  Your belly button may stick out.  You may feel short of breath because of your expanding uterus.  You  may notice the fetus "dropping," or moving lower in your abdomen.  You may have a bloody mucus discharge. This usually occurs a few days to a week before labor begins.  Your cervix becomes thin and soft (effaced) near your due date. WHAT TO EXPECT AT YOUR PRENATAL EXAMS  You will have prenatal exams every 2 weeks until week 36. Then, you will have weekly prenatal exams. During a routine prenatal visit:  You will be weighed to make sure you and the fetus are growing normally.  Your blood pressure is taken.  Your abdomen will be measured to track your baby's growth.  The fetal heartbeat will be listened  to.  Any test results from the previous visit will be discussed.  You may have a cervical check near your due date to see if you have effaced. At around 36 weeks, your caregiver will check your cervix. At the same time, your caregiver will also perform a test on the secretions of the vaginal tissue. This test is to determine if a type of bacteria, Group B streptococcus, is present. Your caregiver will explain this further. Your caregiver may ask you:  What your birth plan is.  How you are feeling.  If you are feeling the baby move.  If you have had any abnormal symptoms, such as leaking fluid, bleeding, severe headaches, or abdominal cramping.  If you have any questions. Other tests or screenings that may be performed during your third trimester include:  Blood tests that check for low iron levels (anemia).  Fetal testing to check the health, activity level, and growth of the fetus. Testing is done if you have certain medical conditions or if there are problems during the pregnancy. FALSE LABOR You may feel small, irregular contractions that eventually go away. These are called Braxton Hicks contractions, or false labor. Contractions may last for hours, days, or even weeks before true labor sets in. If contractions come at regular intervals, intensify, or become painful, it is best to be seen by your caregiver.  SIGNS OF LABOR   Menstrual-like cramps.  Contractions that are 5 minutes apart or less.  Contractions that start on the top of the uterus and spread down to the lower abdomen and back.  A sense of increased pelvic pressure or back pain.  A watery or bloody mucus discharge that comes from the vagina. If you have any of these signs before the 37th week of pregnancy, call your caregiver right away. You need to go to the hospital to get checked immediately. HOME CARE INSTRUCTIONS   Avoid all smoking, herbs, alcohol, and unprescribed drugs. These chemicals affect the  formation and growth of the baby.  Follow your caregiver's instructions regarding medicine use. There are medicines that are either safe or unsafe to take during pregnancy.  Exercise only as directed by your caregiver. Experiencing uterine cramps is a good sign to stop exercising.  Continue to eat regular, healthy meals.  Wear a good support bra for breast tenderness.  Do not use hot tubs, steam rooms, or saunas.  Wear your seat belt at all times when driving.  Avoid raw meat, uncooked cheese, cat litter boxes, and soil used by cats. These carry germs that can cause birth defects in the baby.  Take your prenatal vitamins.  Try taking a stool softener (if your caregiver approves) if you develop constipation. Eat more high-fiber foods, such as fresh vegetables or fruit and whole grains. Drink plenty of fluids to keep your urine clear  or pale yellow.  Take warm sitz baths to soothe any pain or discomfort caused by hemorrhoids. Use hemorrhoid cream if your caregiver approves.  If you develop varicose veins, wear support hose. Elevate your feet for 15 minutes, 3-4 times a day. Limit salt in your diet.  Avoid heavy lifting, wear low heal shoes, and practice good posture.  Rest a lot with your legs elevated if you have leg cramps or low back pain.  Visit your dentist if you have not gone during your pregnancy. Use a soft toothbrush to brush your teeth and be gentle when you floss.  A sexual relationship may be continued unless your caregiver directs you otherwise.  Do not travel far distances unless it is absolutely necessary and only with the approval of your caregiver.  Take prenatal classes to understand, practice, and ask questions about the labor and delivery.  Make a trial run to the hospital.  Pack your hospital bag.  Prepare the baby's nursery.  Continue to go to all your prenatal visits as directed by your caregiver. SEEK MEDICAL CARE IF:  You are unsure if you are in  labor or if your water has broken.  You have dizziness.  You have mild pelvic cramps, pelvic pressure, or nagging pain in your abdominal area.  You have persistent nausea, vomiting, or diarrhea.  You have a bad smelling vaginal discharge.  You have pain with urination. SEEK IMMEDIATE MEDICAL CARE IF:   You have a fever.  You are leaking fluid from your vagina.  You have spotting or bleeding from your vagina.  You have severe abdominal cramping or pain.  You have rapid weight loss or gain.  You have shortness of breath with chest pain.  You notice sudden or extreme swelling of your face, hands, ankles, feet, or legs.  You have not felt your baby move in over an hour.  You have severe headaches that do not go away with medicine.  You have vision changes. Document Released: 10/11/2001 Document Revised: 10/22/2013 Document Reviewed: 12/18/2012 [facility] Patient Information 2015 [place], [place]. This information is not intended to replace advice given to you by your health care provider. Make sure you discuss any questions you have with your health care provider.  [place] Pediatricians/Family Doctors:  [facility] [place] [phone]                 [place] [phone] (usually not accepting new patients unless you have family there already, you are always welcome to call and ask)       [facility] Department [phone]       [facility] Pediatricians/Family Doctors:   [facility]: [phone]  [facility]: [phone]  [facility]: [place] Doctors:   [facility]: [facility]: [place]:  [place]: [phone]

## 2018-05-24 ENCOUNTER — Encounter: Payer: Medicaid Other | Admitting: Advanced Practice Midwife

## 2018-05-29 ENCOUNTER — Ambulatory Visit (INDEPENDENT_AMBULATORY_CARE_PROVIDER_SITE_OTHER): Payer: Medicaid Other | Admitting: Advanced Practice Midwife

## 2018-05-29 ENCOUNTER — Ambulatory Visit (INDEPENDENT_AMBULATORY_CARE_PROVIDER_SITE_OTHER): Payer: Medicaid Other

## 2018-05-29 ENCOUNTER — Encounter: Payer: Self-pay | Admitting: Advanced Practice Midwife

## 2018-05-29 VITALS — BP 114/67 | HR 100 | Wt 129.0 lb

## 2018-05-29 DIAGNOSIS — Z3403 Encounter for supervision of normal first pregnancy, third trimester: Secondary | ICD-10-CM

## 2018-05-29 DIAGNOSIS — O26843 Uterine size-date discrepancy, third trimester: Secondary | ICD-10-CM | POA: Diagnosis not present

## 2018-05-29 DIAGNOSIS — Z1389 Encounter for screening for other disorder: Secondary | ICD-10-CM

## 2018-05-29 DIAGNOSIS — Z3483 Encounter for supervision of other normal pregnancy, third trimester: Secondary | ICD-10-CM

## 2018-05-29 DIAGNOSIS — Z3A33 33 weeks gestation of pregnancy: Secondary | ICD-10-CM

## 2018-05-29 DIAGNOSIS — Z331 Pregnant state, incidental: Secondary | ICD-10-CM

## 2018-05-29 LAB — POCT URINALYSIS DIPSTICK OB
Glucose, UA: NEGATIVE — AB
KETONES UA: NEGATIVE
LEUKOCYTES UA: NEGATIVE
NITRITE UA: NEGATIVE
PROTEIN: NEGATIVE
RBC UA: NEGATIVE

## 2018-05-29 NOTE — Progress Notes (Signed)
[place] 33+2 wks,cephalic,fhr 951 bpm,bilat adnexa's wnl,posterior pl gr 1,afi 15 cm,EFW 2390 g 71%

## 2018-05-29 NOTE — Progress Notes (Signed)
  G3P0010 [redacted]w[redacted]d Estimated Date of Delivery: 07/15/18  Blood pressure 114/67, pulse 100, weight 129 lb (58.5 kg), last menstrual period 10/08/2017.   BP weight and urine results all reviewed and noted.  Please refer to the obstetrical flow sheet for the fundal height and fetal heart rate documentation:  Patient reports good fetal movement, denies any bleeding and no rupture of membranes symptoms or regular contractions. Patient is without complaints. All questions were answered.   Physical Assessment:   Vitals:   05/29/18 1636  BP: 114/67  Pulse: 100  Weight: 129 lb (58.5 kg)  Body mass index is 22.14 kg/m.        Physical Examination:   General appearance: Well appearing, and in no distress  Mental status: Alert, oriented to person, place, and time  Skin: Warm & dry  Cardiovascular: Normal heart rate noted  Respiratory: Normal respiratory effort, no distress  Abdomen: Soft, gravid, nontender  Pelvic: Cervical exam deferred         Extremities: Edema: None  Fetal Status: Fetal Heart Rate (bpm): 164   Movement: Present   [place] 65+5 wks,cephalic,fhr 374 bpm,bilat adnexa's wnl,posterior pl gr 1,afi 15 cm,EFW 2390 g 71%    Results for orders placed or performed in visit on 05/29/18 (from the past 24 hour(s))  POC Urinalysis Dipstick OB   Collection Time: 05/29/18  4:20 PM  Result Value Ref Range   Color, UA     Clarity, UA     Glucose, UA Negative (A) (none)   Bilirubin, UA     Ketones, UA neg    Spec Grav, UA  1.010 - 1.025   Blood, UA neg    pH, UA  5.0 - 8.0   POC Protein UA Negative Negative, Trace   Urobilinogen, UA  0.2 or 1.0 E.U./dL   Nitrite, UA neg    Leukocytes, UA Negative Negative   Appearance     Odor       Orders Placed This Encounter  Procedures  . POC Urinalysis Dipstick OB    Plan:  Continued routine obstetrical care,   Return in about 2 weeks (around 06/12/2018) for [place].

## 2018-05-29 NOTE — Patient Instructions (Signed)
Paschal Dopp, I greatly value your feedback.  If you receive a survey following your visit with [place] today, we appreciate you taking the time to fill it out.  Thanks, Nigel Berthold, CNM   Call the office [phone]) or go to [facility] if:  You begin to have strong, frequent contractions  Your water breaks.  Sometimes it is a big gush of fluid, sometimes it is just a trickle that keeps getting your panties wet or running down your legs  You have vaginal bleeding.  It is normal to have a small amount of spotting if your cervix was checked.   You don't feel your baby moving like normal.  If you don't, get you something to eat and drink and lay down and focus on feeling your baby move.  You should feel at least 10 movements in 2 hours.  If you don't, you should call the office or go to [facility].    Tdap Vaccine  It is recommended that you get the Tdap vaccine during the third trimester of EACH pregnancy to help protect your baby from getting pertussis (whooping cough)  27-36 weeks is the BEST time to do this so that you can pass the protection on to your baby. During pregnancy is better than after pregnancy, but if you are unable to get it during pregnancy it will be offered at the hospital.   You will be offered this vaccine in the office after 27 weeks. If you do not have health insurance, you can get this vaccine at the health department or your family doctor  Everyone who will be around your baby should also be up-to-date on their vaccines. Adults (who are not pregnant) only need 1 dose of Tdap during adulthood.   Third Trimester of Pregnancy The third trimester is from week 29 through week 42, months 7 through 9. The third trimester is a time when the fetus is growing rapidly. At the end of the ninth month, the fetus is about 20 inches in length and weighs 6-10 pounds.  BODY CHANGES Your body goes through many changes during pregnancy. The changes vary from woman to  woman.   Your weight will continue to increase. You can expect to gain 25-35 pounds (11-16 kg) by the end of the pregnancy.  You may begin to get stretch marks on your hips, abdomen, and breasts.  You may urinate more often because the fetus is moving lower into your pelvis and pressing on your bladder.  You may develop or continue to have heartburn as a result of your pregnancy.  You may develop constipation because certain hormones are causing the muscles that push waste through your intestines to slow down.  You may develop hemorrhoids or swollen, bulging veins (varicose veins).  You may have pelvic pain because of the weight gain and pregnancy hormones relaxing your joints between the bones in your pelvis. Backaches may result from overexertion of the muscles supporting your posture.  You may have changes in your hair. These can include thickening of your hair, rapid growth, and changes in texture. Some women also have hair loss during or after pregnancy, or hair that feels dry or thin. Your hair will most likely return to normal after your baby is born.  Your breasts will continue to grow and be tender. A yellow discharge may leak from your breasts called colostrum.  Your belly button may stick out.  You may feel short of breath because of your expanding uterus.  You  may notice the fetus "dropping," or moving lower in your abdomen.  You may have a bloody mucus discharge. This usually occurs a few days to a week before labor begins.  Your cervix becomes thin and soft (effaced) near your due date. WHAT TO EXPECT AT YOUR PRENATAL EXAMS  You will have prenatal exams every 2 weeks until week 36. Then, you will have weekly prenatal exams. During a routine prenatal visit:  You will be weighed to make sure you and the fetus are growing normally.  Your blood pressure is taken.  Your abdomen will be measured to track your baby's growth.  The fetal heartbeat will be listened  to.  Any test results from the previous visit will be discussed.  You may have a cervical check near your due date to see if you have effaced. At around 36 weeks, your caregiver will check your cervix. At the same time, your caregiver will also perform a test on the secretions of the vaginal tissue. This test is to determine if a type of bacteria, Group B streptococcus, is present. Your caregiver will explain this further. Your caregiver may ask you:  What your birth plan is.  How you are feeling.  If you are feeling the baby move.  If you have had any abnormal symptoms, such as leaking fluid, bleeding, severe headaches, or abdominal cramping.  If you have any questions. Other tests or screenings that may be performed during your third trimester include:  Blood tests that check for low iron levels (anemia).  Fetal testing to check the health, activity level, and growth of the fetus. Testing is done if you have certain medical conditions or if there are problems during the pregnancy. FALSE LABOR You may feel small, irregular contractions that eventually go away. These are called Braxton Hicks contractions, or false labor. Contractions may last for hours, days, or even weeks before true labor sets in. If contractions come at regular intervals, intensify, or become painful, it is best to be seen by your caregiver.  SIGNS OF LABOR   Menstrual-like cramps.  Contractions that are 5 minutes apart or less.  Contractions that start on the top of the uterus and spread down to the lower abdomen and back.  A sense of increased pelvic pressure or back pain.  A watery or bloody mucus discharge that comes from the vagina. If you have any of these signs before the 37th week of pregnancy, call your caregiver right away. You need to go to the hospital to get checked immediately. HOME CARE INSTRUCTIONS   Avoid all smoking, herbs, alcohol, and unprescribed drugs. These chemicals affect the  formation and growth of the baby.  Follow your caregiver's instructions regarding medicine use. There are medicines that are either safe or unsafe to take during pregnancy.  Exercise only as directed by your caregiver. Experiencing uterine cramps is a good sign to stop exercising.  Continue to eat regular, healthy meals.  Wear a good support bra for breast tenderness.  Do not use hot tubs, steam rooms, or saunas.  Wear your seat belt at all times when driving.  Avoid raw meat, uncooked cheese, cat litter boxes, and soil used by cats. These carry germs that can cause birth defects in the baby.  Take your prenatal vitamins.  Try taking a stool softener (if your caregiver approves) if you develop constipation. Eat more high-fiber foods, such as fresh vegetables or fruit and whole grains. Drink plenty of fluids to keep your urine clear  or pale yellow.  Take warm sitz baths to soothe any pain or discomfort caused by hemorrhoids. Use hemorrhoid cream if your caregiver approves.  If you develop varicose veins, wear support hose. Elevate your feet for 15 minutes, 3-4 times a day. Limit salt in your diet.  Avoid heavy lifting, wear low heal shoes, and practice good posture.  Rest a lot with your legs elevated if you have leg cramps or low back pain.  Visit your dentist if you have not gone during your pregnancy. Use a soft toothbrush to brush your teeth and be gentle when you floss.  A sexual relationship may be continued unless your caregiver directs you otherwise.  Do not travel far distances unless it is absolutely necessary and only with the approval of your caregiver.  Take prenatal classes to understand, practice, and ask questions about the labor and delivery.  Make a trial run to the hospital.  Pack your hospital bag.  Prepare the baby's nursery.  Continue to go to all your prenatal visits as directed by your caregiver. SEEK MEDICAL CARE IF:  You are unsure if you are in  labor or if your water has broken.  You have dizziness.  You have mild pelvic cramps, pelvic pressure, or nagging pain in your abdominal area.  You have persistent nausea, vomiting, or diarrhea.  You have a bad smelling vaginal discharge.  You have pain with urination. SEEK IMMEDIATE MEDICAL CARE IF:   You have a fever.  You are leaking fluid from your vagina.  You have spotting or bleeding from your vagina.  You have severe abdominal cramping or pain.  You have rapid weight loss or gain.  You have shortness of breath with chest pain.  You notice sudden or extreme swelling of your face, hands, ankles, feet, or legs.  You have not felt your baby move in over an hour.  You have severe headaches that do not go away with medicine.  You have vision changes. Document Released: 10/11/2001 Document Revised: 10/22/2013 Document Reviewed: 12/18/2012 [facility] Patient Information 2015 [place], [place]. This information is not intended to replace advice given to you by your health care provider. Make sure you discuss any questions you have with your health care provider.

## 2018-06-12 ENCOUNTER — Ambulatory Visit (INDEPENDENT_AMBULATORY_CARE_PROVIDER_SITE_OTHER): Payer: Medicaid Other | Admitting: Obstetrics & Gynecology

## 2018-06-12 ENCOUNTER — Encounter: Payer: Self-pay | Admitting: Obstetrics & Gynecology

## 2018-06-12 VITALS — BP 105/66 | HR 86 | Wt 132.0 lb

## 2018-06-12 DIAGNOSIS — Z3483 Encounter for supervision of other normal pregnancy, third trimester: Secondary | ICD-10-CM

## 2018-06-12 DIAGNOSIS — Z3A35 35 weeks gestation of pregnancy: Secondary | ICD-10-CM

## 2018-06-12 DIAGNOSIS — Z1389 Encounter for screening for other disorder: Secondary | ICD-10-CM

## 2018-06-12 DIAGNOSIS — Z331 Pregnant state, incidental: Secondary | ICD-10-CM

## 2018-06-12 LAB — POCT URINALYSIS DIPSTICK OB
GLUCOSE, UA: NEGATIVE — AB
Nitrite, UA: NEGATIVE
RBC UA: NEGATIVE

## 2018-06-12 NOTE — Progress Notes (Signed)
Patient ID: Elizabeth Bird, female   DOB: [birth year], 22 y.o.   MRN: [id number] G3P0010 [redacted]w[redacted]d Estimated Date of Delivery: 07/15/18  Blood pressure 105/66, pulse 86, weight 132 lb (59.9 kg), last menstrual period 10/08/2017.   BP weight and urine results all reviewed and noted.  Please refer to the obstetrical flow sheet for the fundal height and fetal heart rate documentation:  Patient reports good fetal movement, denies any bleeding and no rupture of membranes symptoms or regular contractions. Patient is without complaints. All questions were answered.  Orders Placed This Encounter  Procedures  . POC Urinalysis Dipstick OB    Plan:  Continued routine obstetrical care, 34 cm, FHR 173 Cultures next visit  Return in about 9 days (around 06/21/2018) for [place].

## 2018-06-20 ENCOUNTER — Encounter: Payer: Self-pay | Admitting: Women's Health

## 2018-06-20 ENCOUNTER — Ambulatory Visit (INDEPENDENT_AMBULATORY_CARE_PROVIDER_SITE_OTHER): Payer: Medicaid Other | Admitting: Women's Health

## 2018-06-20 VITALS — BP 101/64 | HR 67 | Wt 133.0 lb

## 2018-06-20 DIAGNOSIS — Z1389 Encounter for screening for other disorder: Secondary | ICD-10-CM

## 2018-06-20 DIAGNOSIS — Z3483 Encounter for supervision of other normal pregnancy, third trimester: Secondary | ICD-10-CM

## 2018-06-20 DIAGNOSIS — Z3A36 36 weeks gestation of pregnancy: Secondary | ICD-10-CM

## 2018-06-20 DIAGNOSIS — Z331 Pregnant state, incidental: Secondary | ICD-10-CM

## 2018-06-20 LAB — POCT URINALYSIS DIPSTICK OB
Glucose, UA: NEGATIVE — AB
Ketones, UA: NEGATIVE
NITRITE UA: NEGATIVE
PROTEIN: NEGATIVE

## 2018-06-20 NOTE — Progress Notes (Signed)
   Work-in LOW-RISK PREGNANCY VISIT Patient name: Elizabeth Bird MRN [id number]  Date of birth: June 29, 1996 Chief Complaint:   Routine Prenatal Visit  History of Present Illness:   Elizabeth Bird is a 22 y.o. G22P0010 female at [redacted]w[redacted]d with an Estimated Date of Delivery: 07/15/18 being seen today for ongoing management of a low-risk pregnancy.  Today she is being seen as a work-in for report of irregular uc's and pressure. Contractions: Irregular. Vag. Bleeding: None.  Movement: Present. denies leaking of fluid. Review of Systems:   Pertinent items are noted in HPI Denies abnormal vaginal discharge w/ itching/odor/irritation, headaches, visual changes, shortness of breath, chest pain, abdominal pain, severe nausea/vomiting, or problems with urination or bowel movements unless otherwise stated above. Pertinent History Reviewed:  Reviewed past medical,surgical, social, obstetrical and family history.  Reviewed problem list, medications and allergies. Physical Assessment:   Vitals:   06/20/18 1529  BP: 101/64  Pulse: 67  Weight: 133 lb (60.3 kg)  Body mass index is 22.83 kg/m.        Physical Examination:   General appearance: Well appearing, and in no distress  Mental status: Alert, oriented to person, place, and time  Skin: Warm & dry  Cardiovascular: Normal heart rate noted  Respiratory: Normal respiratory effort, no distress  Abdomen: Soft, gravid, nontender  Pelvic: Cervical exam performed  Dilation: 2 Effacement (%): 80 Station: -2  Extremities: Edema: None  Fetal Status: Fetal Heart Rate (bpm): 147 Fundal Height: 35 cm Movement: Present Presentation: Vertex  Results for orders placed or performed in visit on 06/20/18 (from the past 24 hour(s))  POC Urinalysis Dipstick OB   Collection Time: 06/20/18  3:25 PM  Result Value Ref Range   Color, UA     Clarity, UA     Glucose, UA Negative (A) (none)   Bilirubin, UA     Ketones, UA neg    Spec Grav, UA     Blood, UA small    pH,  UA     POC Protein UA Negative Negative, Trace   Urobilinogen, UA     Nitrite, UA neg    Leukocytes, UA Small (1+) (A) Negative   Appearance     Odor      Assessment & Plan:  1) Low-risk pregnancy G3P0010 at [redacted]w[redacted]d with an Estimated Date of Delivery: 07/15/18   2) Irregular uc's, sve 2/80/-2, vtx, increase water intake, reviewed labor s/s, reasons to seek care   Meds: No orders of the defined types were placed in this encounter.  Labs/procedures today: sve, gbs, gc/ct  Plan:  Continue routine obstetrical care   Reviewed: Preterm labor symptoms and general obstetric precautions including but not limited to vaginal bleeding, contractions, leaking of fluid and fetal movement were reviewed in detail with the patient.  All questions were answered  Follow-up: Return for cancel tomorrow, f/u 1wk for LROB.  Orders Placed This Encounter  Procedures  . POC Urinalysis Dipstick OB   Roma Schanz CNM, [facility] 06/20/2018 3:58 PM

## 2018-06-20 NOTE — Patient Instructions (Signed)
Paschal Dopp, I greatly value your feedback.  If you receive a survey following your visit with [place] today, we appreciate you taking the time to fill it out.  Thanks, Knute Neu, CNM, WHNP-BC   Call the office [phone]) or go to [facility] if:  You begin to have strong, frequent contractions  Your water breaks.  Sometimes it is a big gush of fluid, sometimes it is just a trickle that keeps getting your panties wet or running down your legs  You have vaginal bleeding.  It is normal to have a small amount of spotting if your cervix was checked.   You don't feel your baby moving like normal.  If you don't, get you something to eat and drink and lay down and focus on feeling your baby move.  You should feel at least 10 movements in 2 hours.  If you don't, you should call the office or go to [place] Contractions Contractions of the uterus can occur throughout pregnancy, but they are not always a sign that you are in labor. You may have practice contractions called Braxton Hicks contractions. These false labor contractions are sometimes confused with true labor. What are [place] contractions? Braxton Hicks contractions are tightening movements that occur in the muscles of the uterus before labor. Unlike true labor contractions, these contractions do not result in opening (dilation) and thinning of the cervix. Toward the end of pregnancy (32-34 weeks), Braxton Hicks contractions can happen more often and may become stronger. These contractions are sometimes difficult to tell apart from true labor because they can be very uncomfortable. You should not feel embarrassed if you go to the hospital with false labor. Sometimes, the only way to tell if you are in true labor is for your health care provider to look for changes in the cervix. The health care provider will do a physical exam and may monitor your contractions. If you are not in true labor, the exam should  show that your cervix is not dilating and your water has not broken. If there are other health problems associated with your pregnancy, it is completely safe for you to be sent home with false labor. You may continue to have Braxton Hicks contractions until you go into true labor. How to tell the difference between true labor and false labor True labor  Contractions last 30-70 seconds.  Contractions become very regular.  Discomfort is usually felt in the top of the uterus, and it spreads to the lower abdomen and low back.  Contractions do not go away with walking.  Contractions usually become more intense and increase in frequency.  The cervix dilates and gets thinner. False labor  Contractions are usually shorter and not as strong as true labor contractions.  Contractions are usually irregular.  Contractions are often felt in the front of the lower abdomen and in the groin.  Contractions may go away when you walk around or change positions while lying down.  Contractions get weaker and are shorter-lasting as time goes on.  The cervix usually does not dilate or become thin. Follow these instructions at home:  Take over-the-counter and prescription medicines only as told by your health care provider.  Keep up with your usual exercises and follow other instructions from your health care provider.  Eat and drink lightly if you think you are going into labor.  If Braxton Hicks contractions are making you uncomfortable: ? Change your position from lying down  or resting to walking, or change from walking to resting. ? Sit and rest in a tub of warm water. ? Drink enough fluid to keep your urine pale yellow. Dehydration may cause these contractions. ? Do slow and deep breathing several times an hour.  Keep all follow-up prenatal visits as told by your health care provider. This is important. Contact a health care provider if:  You have a fever.  You have continuous pain in  your abdomen. Get help right away if:  Your contractions become stronger, more regular, and closer together.  You have fluid leaking or gushing from your vagina.  You pass blood-tinged mucus (bloody show).  You have bleeding from your vagina.  You have low back pain that you never had before.  You feel your baby's head pushing down and causing pelvic pressure.  Your baby is not moving inside you as much as it used to. Summary  Contractions that occur before labor are called Braxton Hicks contractions, false labor, or practice contractions.  Braxton Hicks contractions are usually shorter, weaker, farther apart, and less regular than true labor contractions. True labor contractions usually become progressively stronger and regular and they become more frequent.  Manage discomfort from [facility] contractions by changing position, resting in a warm bath, drinking plenty of water, or practicing deep breathing. This information is not intended to replace advice given to you by your health care provider. Make sure you discuss any questions you have with your health care provider. Document Released: 03/02/2017 Document Revised: 03/02/2017 Document Reviewed: 03/02/2017 Elsevier Interactive Patient Education  2018 [employer].

## 2018-06-20 NOTE — Addendum Note (Signed)
Addended by: Christiana Pellant A on: 06/20/2018 04:14 PM   Modules accepted: Orders

## 2018-06-21 ENCOUNTER — Encounter (HOSPITAL_COMMUNITY): Payer: Self-pay | Admitting: *Deleted

## 2018-06-21 ENCOUNTER — Inpatient Hospital Stay (HOSPITAL_COMMUNITY)
Admission: AD | Admit: 2018-06-21 | Discharge: 2018-06-21 | Disposition: A | Discharge status: Home / Self Care | Payer: Medicaid Other | Source: Ambulatory Visit | Attending: Obstetrics and Gynecology | Admitting: Obstetrics and Gynecology

## 2018-06-21 ENCOUNTER — Encounter: Payer: Medicaid Other | Admitting: Obstetrics & Gynecology

## 2018-06-21 DIAGNOSIS — Z3A36 36 weeks gestation of pregnancy: Secondary | ICD-10-CM | POA: Diagnosis not present

## 2018-06-21 DIAGNOSIS — O4703 False labor before 37 completed weeks of gestation, third trimester: Secondary | ICD-10-CM | POA: Insufficient documentation

## 2018-06-21 DIAGNOSIS — Z3483 Encounter for supervision of other normal pregnancy, third trimester: Secondary | ICD-10-CM

## 2018-06-21 DIAGNOSIS — O479 False labor, unspecified: Secondary | ICD-10-CM

## 2018-06-21 NOTE — MAU Note (Signed)
Urine in lab 

## 2018-06-21 NOTE — MAU Note (Signed)
PT SAYS STRONG UC'S SINCE 730PM.    PNC- WITH [facility]-  VE 2 CM  YESTERDAY.  DENIES  HSV AND  MRSA.  GBS- COLLECTED-  YESTERDAY.

## 2018-06-21 NOTE — Discharge Instructions (Signed)
Braxton Hicks Contractions °Contractions of the uterus can occur throughout pregnancy, but they are not always a sign that you are in labor. You may have practice contractions called Braxton Hicks contractions. These false labor contractions are sometimes confused with true labor. °What are Braxton Hicks contractions? °Braxton Hicks contractions are tightening movements that occur in the muscles of the uterus before labor. Unlike true labor contractions, these contractions do not result in opening (dilation) and thinning of the cervix. Toward the end of pregnancy (32-34 weeks), Braxton Hicks contractions can happen more often and may become stronger. These contractions are sometimes difficult to tell apart from true labor because they can be very uncomfortable. You should not feel embarrassed if you go to the hospital with false labor. °Sometimes, the only way to tell if you are in true labor is for your health care provider to look for changes in the cervix. The health care provider will do a physical exam and may monitor your contractions. If you are not in true labor, the exam should show that your cervix is not dilating and your water has not broken. °If there are other health problems associated with your pregnancy, it is completely safe for you to be sent home with false labor. You may continue to have Braxton Hicks contractions until you go into true labor. °How to tell the difference between true labor and false labor °True labor °· Contractions last 30-70 seconds. °· Contractions become very regular. °· Discomfort is usually felt in the top of the uterus, and it spreads to the lower abdomen and low back. °· Contractions do not go away with walking. °· Contractions usually become more intense and increase in frequency. °· The cervix dilates and gets thinner. °False labor °· Contractions are usually shorter and not as strong as true labor contractions. °· Contractions are usually irregular. °· Contractions  are often felt in the front of the lower abdomen and in the groin. °· Contractions may go away when you walk around or change positions while lying down. °· Contractions get weaker and are shorter-lasting as time goes on. °· The cervix usually does not dilate or become thin. °Follow these instructions at home: °· Take over-the-counter and prescription medicines only as told by your health care provider. °· Keep up with your usual exercises and follow other instructions from your health care provider. °· Eat and drink lightly if you think you are going into labor. °· If Braxton Hicks contractions are making you uncomfortable: °? Change your position from lying down or resting to walking, or change from walking to resting. °? Sit and rest in a tub of warm water. °? Drink enough fluid to keep your urine pale yellow. Dehydration may cause these contractions. °? Do slow and deep breathing several times an hour. °· Keep all follow-up prenatal visits as told by your health care provider. This is important. °Contact a health care provider if: °· You have a fever. °· You have continuous pain in your abdomen. °Get help right away if: °· Your contractions become stronger, more regular, and closer together. °· You have fluid leaking or gushing from your vagina. °· You pass blood-tinged mucus (bloody show). °· You have bleeding from your vagina. °· You have low back pain that you never had before. °· You feel your baby’s head pushing down and causing pelvic pressure. °· Your baby is not moving inside you as much as it used to. °Summary °· Contractions that occur before labor are called Braxton   Hicks contractions, false labor, or practice contractions. °· Braxton Hicks contractions are usually shorter, weaker, farther apart, and less regular than true labor contractions. True labor contractions usually become progressively stronger and regular and they become more frequent. °· Manage discomfort from Braxton Hicks contractions by  changing position, resting in a warm bath, drinking plenty of water, or practicing deep breathing. °This information is not intended to replace advice given to you by your health care provider. Make sure you discuss any questions you have with your health care provider. °Document Released: 03/02/2017 Document Revised: 03/02/2017 Document Reviewed: 03/02/2017 °Elsevier Interactive Patient Education © 2018 Elsevier Inc. ° °

## 2018-06-21 NOTE — Discharge Summary (Addendum)
Patient with intermittent cramping.  Presented for labor check.  Found to be dilated to 2 on exam, patient asked to walk for 1hr to see if progressing and unchanged on recheck.  Cat1 tracing on FHM.   Patient not in active labor, will d/c home with return precautions. -Dr. Criss Rosales

## 2018-06-21 NOTE — MAU Note (Signed)
I have communicated with Dr Criss Rosales  and reviewed vital signs:  Vitals:   06/21/18 2141 06/21/18 2330  BP: 116/63 103/61  Pulse: (!) 110   Resp: 18   Temp: 98 F (36.7 C)     Vaginal exam:  Dilation: 2.5 Effacement (%): 80 Cervical Position: Middle Station: -2 Presentation: Vertex Exam by:: TLYTLE RN ,   Also reviewed contraction pattern and that non-stress test is reactive.  It has been documented that patient is contracting every 2-4  minutes with no cervical change over 1 hours not indicating active labor.  Patient denies any other complaints.  Based on this report provider has given order for discharge.  A discharge order and diagnosis entered by a provider.   Labor discharge instructions reviewed with patient.

## 2018-06-22 LAB — GC/CHLAMYDIA PROBE AMP
Chlamydia trachomatis, NAA: NEGATIVE
Neisseria gonorrhoeae by PCR: NEGATIVE

## 2018-06-24 LAB — CULTURE, BETA STREP (GROUP B ONLY): STREP GP B CULTURE: NEGATIVE

## 2018-06-28 ENCOUNTER — Encounter: Payer: Self-pay | Admitting: Advanced Practice Midwife

## 2018-06-28 ENCOUNTER — Ambulatory Visit (INDEPENDENT_AMBULATORY_CARE_PROVIDER_SITE_OTHER): Payer: Medicaid Other | Admitting: Advanced Practice Midwife

## 2018-06-28 VITALS — BP 96/65 | HR 95 | Wt 134.0 lb

## 2018-06-28 DIAGNOSIS — Z3483 Encounter for supervision of other normal pregnancy, third trimester: Secondary | ICD-10-CM | POA: Diagnosis not present

## 2018-06-28 DIAGNOSIS — Z3A37 37 weeks gestation of pregnancy: Secondary | ICD-10-CM | POA: Diagnosis not present

## 2018-06-28 DIAGNOSIS — Z331 Pregnant state, incidental: Secondary | ICD-10-CM

## 2018-06-28 DIAGNOSIS — Z1389 Encounter for screening for other disorder: Secondary | ICD-10-CM

## 2018-06-28 LAB — POCT URINALYSIS DIPSTICK OB
GLUCOSE, UA: NEGATIVE
Ketones, UA: NEGATIVE
LEUKOCYTES UA: NEGATIVE
Nitrite, UA: NEGATIVE
RBC UA: NEGATIVE

## 2018-06-28 NOTE — Patient Instructions (Addendum)

## 2018-06-28 NOTE — Progress Notes (Signed)
  G3P0010 [redacted]w[redacted]d Estimated Date of Delivery: 07/15/18  Blood pressure 96/65, pulse 95, weight 134 lb (60.8 kg), last menstrual period 10/08/2017.   BP weight and urine results all reviewed and noted.  Please refer to the obstetrical flow sheet for the fundal height and fetal heart rate documentation:  Patient reports good fetal movement, denies any bleeding and no rupture of membranes symptoms or regular contractions. Patient is without complaints. All questions were answered.   Physical Assessment:   Vitals:   06/28/18 1616  BP: 96/65  Pulse: 95  Weight: 134 lb (60.8 kg)  Body mass index is 23 kg/m.        Physical Examination:   General appearance: Well appearing, and in no distress  Mental status: Alert, oriented to person, place, and time  Skin: Warm & dry  Cardiovascular: Normal heart rate noted  Respiratory: Normal respiratory effort, no distress  Abdomen: Soft, gravid, nontender  Pelvic: Cervical exam performed  Dilation: 3 Effacement (%): 80 Station: 0  Extremities: Edema: None  Fetal Status: Fetal Heart Rate (bpm): 145 Fundal Height: 36 cm Movement: Present Presentation: Vertex  Results for orders placed or performed in visit on 06/28/18 (from the past 24 hour(s))  POC Urinalysis Dipstick OB   Collection Time: 06/28/18  4:17 PM  Result Value Ref Range   Color, UA     Clarity, UA     Glucose, UA Negative Negative   Bilirubin, UA     Ketones, UA neg    Spec Grav, UA     Blood, UA neg    pH, UA     POC Protein UA Trace Negative, Trace   Urobilinogen, UA     Nitrite, UA neg    Leukocytes, UA Negative Negative   Appearance     Odor       Orders Placed This Encounter  Procedures  . POC Urinalysis Dipstick OB    Plan:  Continued routine obstetrical care,   Return in about 1 week (around 07/05/2018) for LROB.

## 2018-07-01 ENCOUNTER — Inpatient Hospital Stay (HOSPITAL_COMMUNITY): Payer: Medicaid Other | Admitting: Anesthesiology

## 2018-07-01 ENCOUNTER — Inpatient Hospital Stay (HOSPITAL_COMMUNITY)
Admission: AD | Admit: 2018-07-01 | Discharge: 2018-07-03 | DRG: 807 | Disposition: A | Discharge status: Home / Self Care | Payer: Medicaid Other | Attending: Obstetrics & Gynecology | Admitting: Obstetrics & Gynecology

## 2018-07-01 ENCOUNTER — Encounter (HOSPITAL_COMMUNITY): Payer: Self-pay | Admitting: *Deleted

## 2018-07-01 ENCOUNTER — Other Ambulatory Visit: Payer: Self-pay

## 2018-07-01 DIAGNOSIS — Z3A38 38 weeks gestation of pregnancy: Secondary | ICD-10-CM

## 2018-07-01 DIAGNOSIS — Z87891 Personal history of nicotine dependence: Secondary | ICD-10-CM

## 2018-07-01 DIAGNOSIS — Z3483 Encounter for supervision of other normal pregnancy, third trimester: Secondary | ICD-10-CM

## 2018-07-01 LAB — CBC
HEMATOCRIT: 36.6 % (ref 36.0–46.0)
HEMOGLOBIN: 12.3 g/dL (ref 12.0–15.0)
MCH: 30.8 pg (ref 26.0–34.0)
MCHC: 33.6 g/dL (ref 30.0–36.0)
MCV: 91.7 fL (ref 78.0–100.0)
Platelets: 311 10*3/uL (ref 150–400)
RBC: 3.99 MIL/uL (ref 3.87–5.11)
RDW: 13.1 % (ref 11.5–15.5)
WBC: 9.6 10*3/uL (ref 4.0–10.5)

## 2018-07-01 LAB — TYPE AND SCREEN
ABO/RH(D): A POS
Antibody Screen: NEGATIVE

## 2018-07-01 LAB — ABO/RH: ABO/RH(D): A POS

## 2018-07-01 LAB — RPR: RPR Ser Ql: NONREACTIVE

## 2018-07-01 MED ORDER — LACTATED RINGERS IV SOLN: 500.0000 mL | INTRAVENOUS | Status: DC | PRN

## 2018-07-01 MED ORDER — SODIUM CHLORIDE 0.9% FLUSH: 3.0000 mL | Freq: Two times a day (BID) | INTRAVENOUS | Status: DC

## 2018-07-01 MED ORDER — LACTATED RINGERS IV SOLN
500.0000 mL | Freq: Once | INTRAVENOUS | Status: AC
  Administered 2018-07-01: 500 mL via INTRAVENOUS

## 2018-07-01 MED ORDER — BUPIVACAINE HCL (PF) 0.25 % IJ SOLN
INTRAMUSCULAR | Status: DC | PRN
  Administered 2018-07-01 (×2): 5 mL via EPIDURAL

## 2018-07-01 MED ORDER — SOD CITRATE-CITRIC ACID 500-334 MG/5ML PO SOLN: 30.0000 mL | ORAL | Status: DC | PRN

## 2018-07-01 MED ORDER — ACETAMINOPHEN 325 MG PO TABS: 650.0000 mg | ORAL_TABLET | ORAL | Status: DC | PRN

## 2018-07-01 MED ORDER — LACTATED RINGERS IV SOLN
INTRAVENOUS | Status: DC
  Administered 2018-07-01: 07:00:00 via INTRAVENOUS

## 2018-07-01 MED ORDER — OXYCODONE-ACETAMINOPHEN 5-325 MG PO TABS: 1.0000 | ORAL_TABLET | ORAL | Status: DC | PRN

## 2018-07-01 MED ORDER — TETANUS-DIPHTH-ACELL PERTUSSIS 5-2.5-18.5 LF-MCG/0.5 IM SUSP: 0.5000 mL | Freq: Once | INTRAMUSCULAR | Status: DC

## 2018-07-01 MED ORDER — ONDANSETRON HCL 4 MG PO TABS: 4.0000 mg | ORAL_TABLET | ORAL | Status: DC | PRN

## 2018-07-01 MED ORDER — EPHEDRINE 5 MG/ML INJ
10.0000 mg | INTRAVENOUS | Status: DC | PRN
  Filled 2018-07-01: qty 2

## 2018-07-01 MED ORDER — TERBUTALINE SULFATE 1 MG/ML IJ SOLN
INTRAMUSCULAR | Status: AC
  Filled 2018-07-01: qty 1

## 2018-07-01 MED ORDER — ONDANSETRON HCL 4 MG/2ML IJ SOLN: 4.0000 mg | Freq: Four times a day (QID) | INTRAMUSCULAR | Status: DC | PRN

## 2018-07-01 MED ORDER — FENTANYL 2.5 MCG/ML BUPIVACAINE 1/10 % EPIDURAL INFUSION (WH - ANES)
14.0000 mL/h | INTRAMUSCULAR | Status: DC | PRN
  Administered 2018-07-01: 14 mL/h via EPIDURAL
  Filled 2018-07-01: qty 100

## 2018-07-01 MED ORDER — SIMETHICONE 80 MG PO CHEW: 80.0000 mg | CHEWABLE_TABLET | ORAL | Status: DC | PRN

## 2018-07-01 MED ORDER — LIDOCAINE HCL (PF) 1 % IJ SOLN
30.0000 mL | INTRAMUSCULAR | Status: DC | PRN
  Filled 2018-07-01: qty 30

## 2018-07-01 MED ORDER — ONDANSETRON HCL 4 MG/2ML IJ SOLN: 4.0000 mg | INTRAMUSCULAR | Status: DC | PRN

## 2018-07-01 MED ORDER — PRENATAL MULTIVITAMIN CH
1.0000 | ORAL_TABLET | Freq: Every day | ORAL | Status: DC
  Administered 2018-07-01 – 2018-07-02 (×2): 1 via ORAL
  Filled 2018-07-01 (×2): qty 1

## 2018-07-01 MED ORDER — FENTANYL CITRATE (PF) 100 MCG/2ML IJ SOLN: 100.0000 ug | INTRAMUSCULAR | Status: DC | PRN

## 2018-07-01 MED ORDER — FENTANYL CITRATE (PF) 100 MCG/2ML IJ SOLN: 100.0000 ug | Freq: Once | INTRAMUSCULAR | Status: DC

## 2018-07-01 MED ORDER — FENTANYL CITRATE (PF) 100 MCG/2ML IJ SOLN: 100.0000 ug | INTRAMUSCULAR | Status: DC

## 2018-07-01 MED ORDER — LACTATED RINGERS IV SOLN
INTRAVENOUS | Status: DC
  Administered 2018-07-01: 06:00:00 via INTRAVENOUS

## 2018-07-01 MED ORDER — ZOLPIDEM TARTRATE 5 MG PO TABS: 5.0000 mg | ORAL_TABLET | Freq: Every evening | ORAL | Status: DC | PRN

## 2018-07-01 MED ORDER — OXYCODONE-ACETAMINOPHEN 5-325 MG PO TABS: 2.0000 | ORAL_TABLET | ORAL | Status: DC | PRN

## 2018-07-01 MED ORDER — MEASLES, MUMPS & RUBELLA VAC ~~LOC~~ INJ
0.5000 mL | INJECTION | Freq: Once | SUBCUTANEOUS | Status: DC
  Filled 2018-07-01: qty 0.5

## 2018-07-01 MED ORDER — SODIUM CHLORIDE 0.9 % IV SOLN: 250.0000 mL | INTRAVENOUS | Status: DC | PRN

## 2018-07-01 MED ORDER — SODIUM CHLORIDE 0.9% FLUSH: 3.0000 mL | INTRAVENOUS | Status: DC | PRN

## 2018-07-01 MED ORDER — SENNOSIDES-DOCUSATE SODIUM 8.6-50 MG PO TABS
2.0000 | ORAL_TABLET | ORAL | Status: DC
  Administered 2018-07-01 – 2018-07-02 (×2): 2 via ORAL
  Filled 2018-07-01 (×2): qty 2

## 2018-07-01 MED ORDER — OXYTOCIN 40 UNITS IN LACTATED RINGERS INFUSION - SIMPLE MED
2.5000 [IU]/h | INTRAVENOUS | Status: DC
  Filled 2018-07-01: qty 1000

## 2018-07-01 MED ORDER — OXYTOCIN 10 UNIT/ML IJ SOLN
INTRAMUSCULAR | Status: AC
  Filled 2018-07-01: qty 1

## 2018-07-01 MED ORDER — PHENYLEPHRINE 40 MCG/ML (10ML) SYRINGE FOR IV PUSH (FOR BLOOD PRESSURE SUPPORT)
80.0000 ug | PREFILLED_SYRINGE | INTRAVENOUS | Status: DC | PRN
  Filled 2018-07-01: qty 10
  Filled 2018-07-01: qty 5

## 2018-07-01 MED ORDER — DIPHENHYDRAMINE HCL 50 MG/ML IJ SOLN: 12.5000 mg | INTRAMUSCULAR | Status: DC | PRN

## 2018-07-01 MED ORDER — WITCH HAZEL-GLYCERIN EX PADS: 1.0000 "application " | MEDICATED_PAD | CUTANEOUS | Status: DC | PRN

## 2018-07-01 MED ORDER — OXYTOCIN BOLUS FROM INFUSION
500.0000 mL | Freq: Once | INTRAVENOUS | Status: AC
  Administered 2018-07-01: 500 mL via INTRAVENOUS

## 2018-07-01 MED ORDER — DIBUCAINE 1 % RE OINT: 1.0000 "application " | TOPICAL_OINTMENT | RECTAL | Status: DC | PRN

## 2018-07-01 MED ORDER — PHENYLEPHRINE 40 MCG/ML (10ML) SYRINGE FOR IV PUSH (FOR BLOOD PRESSURE SUPPORT)
80.0000 ug | PREFILLED_SYRINGE | INTRAVENOUS | Status: DC | PRN
  Filled 2018-07-01: qty 5

## 2018-07-01 MED ORDER — LIDOCAINE HCL (PF) 1 % IJ SOLN
INTRAMUSCULAR | Status: DC | PRN
  Administered 2018-07-01: 4 mL via EPIDURAL

## 2018-07-01 MED ORDER — IBUPROFEN 600 MG PO TABS
600.0000 mg | ORAL_TABLET | Freq: Four times a day (QID) | ORAL | Status: DC
  Administered 2018-07-01 – 2018-07-03 (×7): 600 mg via ORAL
  Filled 2018-07-01 (×7): qty 1

## 2018-07-01 MED ORDER — DIPHENHYDRAMINE HCL 25 MG PO CAPS: 25.0000 mg | ORAL_CAPSULE | Freq: Four times a day (QID) | ORAL | Status: DC | PRN

## 2018-07-01 MED ORDER — BENZOCAINE-MENTHOL 20-0.5 % EX AERO: 1.0000 "application " | INHALATION_SPRAY | CUTANEOUS | Status: DC | PRN

## 2018-07-01 MED ORDER — COCONUT OIL OIL: 1.0000 "application " | TOPICAL_OIL | Status: DC | PRN

## 2018-07-01 NOTE — Anesthesia Preprocedure Evaluation (Signed)
Anesthesia Evaluation  Patient identified by MRN, date of birth, ID band Patient awake    Reviewed: Allergy & Precautions, NPO status , Patient's Chart, lab work & pertinent test results  Airway Mallampati: I  TM Distance: >3 FB Neck ROM: Full    Dental no notable dental hx.    Pulmonary neg pulmonary ROS, former smoker,    Pulmonary exam normal breath sounds clear to auscultation       Cardiovascular negative cardio ROS Normal cardiovascular exam Rhythm:Regular Rate:Normal     Neuro/Psych negative neurological ROS  negative psych ROS   GI/Hepatic negative GI ROS, Neg liver ROS,   Endo/Other  negative endocrine ROS  Renal/GU negative Renal ROS  negative genitourinary   Musculoskeletal negative musculoskeletal ROS (+)   Abdominal   Peds negative pediatric ROS (+)  Hematology negative hematology ROS (+)   Anesthesia Other Findings   Reproductive/Obstetrics negative OB ROS (+) Pregnancy                             Anesthesia Physical Anesthesia Plan  ASA: II  Anesthesia Plan: Epidural   Post-op Pain Management:    Induction:   PONV Risk Score and Plan: Treatment may vary due to age or medical condition  Airway Management Planned: Natural Airway  Additional Equipment:   Intra-op Plan:   Post-operative Plan:   Informed Consent: I have reviewed the patients History and Physical, chart, labs and discussed the procedure including the risks, benefits and alternatives for the proposed anesthesia with the patient or authorized representative who has indicated his/her understanding and acceptance.     Plan Discussed with: Anesthesiologist  Anesthesia Plan Comments: (Patient identified. Risks, benefits, options discussed with patient including but not limited to bleeding, infection, nerve damage, paralysis, failed block, incomplete pain control, headache, blood pressure changes,  nausea, vomiting, reactions to medication, itching, and post partum back pain. Confirmed with bedside nurse the patient's most recent platelet count. Confirmed with the patient that they are not taking any anticoagulation, have any bleeding history or any family history of bleeding disorders. Patient expressed understanding and wishes to proceed. All questions were answered. )        Anesthesia Quick Evaluation

## 2018-07-01 NOTE — Anesthesia Postprocedure Evaluation (Signed)
Anesthesia Post Note  Patient: [name]  Procedure(s) Performed: AN AD [place]     Patient location during evaluation: Mother Baby Anesthesia Type: Epidural Level of consciousness: awake and alert and oriented Pain management: satisfactory to patient Vital Signs Assessment: post-procedure vital signs reviewed and stable Respiratory status: spontaneous breathing and nonlabored ventilation Cardiovascular status: stable Postop Assessment: no headache, no backache, no signs of nausea or vomiting, adequate PO intake, patient able to bend at knees and epidural receding (patient up walking) Anesthetic complications: no    Last Vitals:  Vitals:   07/01/18 1125 07/01/18 1245  BP: 110/71 111/66  Pulse: 70 88  Resp: 18 17  Temp: 37.5 C 37.7 C  SpO2: 98%     Last Pain:  Vitals:   07/01/18 1245  TempSrc: Oral  PainSc: 2    Pain Goal:                 Elizabeth Bird

## 2018-07-01 NOTE — Lactation Note (Signed)
This note was copied from a baby's chart. Lactation Consultation Note  Patient Name: Elizabeth Bird [id number] Date: 07/01/2018 Reason for consult: Initial assessment;Primapara;1st time breastfeeding;Early term 37-38.6wks  P1 mother whose infant is now 91 hours old.  Mother had baby latched onto the right breast in the cradle hold.  Mother had baby swaddled so I suggested doing STS with the next feeding.  Mother's breasts are soft and non tender and she states no pain with latching.  Demonstrated breast compressions and showed mother how to stimulate baby to keep awake at the breast.  Encouraged mother to feed 8-12 times/24 hours or sooner if baby shows feeding cues.  Reviewed feeding cues.  Continue STS and mother will do hand expression before and after feedings. Colostrum container provided for any EBM she obtains with hand expression.  Mother will finger feed any EBM she obtains to baby.   Mom made aware of O/P services, breastfeeding support groups, community resources, and our phone # for post-discharge questions. Father present.  Mother will call for assistance as needed.   Maternal Data Formula Feeding for Exclusion: No Has patient been taught Hand Expression?: Yes Does the patient have breastfeeding experience prior to this delivery?: No  Feeding Feeding Type: Breast Fed Length of feed: 20 min(still feeding when I left the room)  LATCH Score Latch: Grasps breast easily, tongue down, lips flanged, rhythmical sucking.  Audible Swallowing: A few with stimulation  Type of Nipple: Everted at rest and after stimulation  Comfort (Breast/Nipple): Soft / non-tender  Hold (Positioning): No assistance needed to correctly position infant at breast.  LATCH Score: 9  Interventions Interventions: Breast feeding basics reviewed;Assisted with latch;Skin to skin;Breast massage;Hand express;Position options;Support pillows;Adjust position;Breast compression  Lactation Tools  Discussed/Used WIC Program: Yes   Consult Status Consult Status: Follow-up Date: 07/02/18 Follow-up type: In-patient    Georgann Bramble R Mercadez Heitman 07/01/2018, 7:13 PM

## 2018-07-01 NOTE — Anesthesia Procedure Notes (Signed)
Epidural Patient location during procedure: OB  Staffing Anesthesiologist: Freddrick March, MD Performed: anesthesiologist   Preanesthetic Checklist Completed: patient identified, pre-op evaluation, timeout performed, IV checked, risks and benefits discussed and monitors and equipment checked  Epidural Patient position: sitting Prep: site prepped and draped and DuraPrep Patient monitoring: continuous pulse ox, blood pressure, heart rate and cardiac monitor Approach: midline Location: L3-L4 Injection technique: LOR air  Needle:  Needle type: Tuohy  Needle gauge: 17 G Needle length: 9 cm Needle insertion depth: 4.5 cm Catheter type: closed end flexible Catheter size: 19 Gauge Catheter at skin depth: 10 cm Test dose: negative  Assessment Sensory level: T8 Events: blood not aspirated, injection not painful, no injection resistance, negative IV test and no paresthesia  Additional Notes Patient identified. Risks/Benefits/Options discussed with patient including but not limited to bleeding, infection, nerve damage, paralysis, failed block, incomplete pain control, headache, blood pressure changes, nausea, vomiting, reactions to medication both or allergic, itching and postpartum back pain. Confirmed with bedside nurse the patient's most recent platelet count. Confirmed with patient that they are not currently taking any anticoagulation, have any bleeding history or any family history of bleeding disorders. Patient expressed understanding and wished to proceed. All questions were answered. Sterile technique was used throughout the entire procedure. Please see nursing notes for vital signs. Test dose was given through epidural catheter and negative prior to continuing to dose epidural or start infusion. Warning signs of high block given to the patient including shortness of breath, tingling/numbness in hands, complete motor block, or any concerning symptoms with instructions to call for  help. Patient was given instructions on fall risk and not to get out of bed. All questions and concerns addressed with instructions to call with any issues or inadequate analgesia.  Reason for block:procedure for pain

## 2018-07-01 NOTE — MAU Note (Signed)
Pt woke up with contractions at 3 am. Pt contracting every 2-3 minutes

## 2018-07-01 NOTE — H&P (Signed)
Elizabeth Bird is a 22 y.o. female G3P0010 @ 11  presenting for SOL.GBS neg OB History    Gravida  3   Para  1   Term      Preterm      AB  1   Living  0     SAB  1   TAB      Ectopic      Multiple  0   Live Births             Past Medical History:  Diagnosis Date  . Medical history non-contributory    Past Surgical History:  Procedure Laterality Date  . NO PAST SURGERIES     Family History: family history includes Arthritis in her maternal grandmother; Asthma in her maternal aunt; Bipolar disorder in her sister; Cancer in her maternal aunt; Multiple sclerosis in her maternal grandmother and mother; Post-traumatic stress disorder in her sister; Schizophrenia in her sister; Suicidality in her maternal grandfather; Thyroid disease in her sister. Social History:  reports that she has quit smoking. Her smoking use included cigarettes. She quit after 7.00 years of use. She has never used smokeless tobacco. She reports that she does not drink alcohol or use drugs.     Maternal Diabetes: No Genetic Screening: Normal Maternal Ultrasounds/Referrals: Normal Fetal Ultrasounds or other Referrals:  None Maternal Substance Abuse:  No Significant Maternal Medications:  None Significant Maternal Lab Results:  None Other Comments:  None  ROS Maternal Medical History:  Reason for admission: Contractions.   Contractions: Onset was 3-5 hours ago.   Frequency: regular.   Perceived severity is strong.    Fetal activity: Perceived fetal activity is normal.   Last perceived fetal movement was within the past hour.    Prenatal complications: no prenatal complications Prenatal Complications - Diabetes: none.    Dilation: 10 Effacement (%): 80 Station: Plus 2 Exam by:: Cecelia Byars Blood pressure 103/73, pulse 85, temperature 97.8 F (36.6 C), temperature source Oral, resp. rate 18, last menstrual period 10/08/2017, SpO2 100 %. Maternal Exam:  Uterine Assessment:  Contraction strength is firm.  Contraction frequency is regular.   Abdomen: Patient reports no abdominal tenderness. Fetal presentation: vertex  Introitus: Normal vulva. Normal vagina.  Ferning test: not done.  Nitrazine test: not done. Amniotic fluid character: not assessed.  Pelvis: adequate for delivery.   Cervix: Cervix evaluated by digital exam.     Fetal Exam Fetal Monitor Review: Mode: ultrasound.   Variability: moderate (6-25 bpm).   Pattern: accelerations present and no decelerations.    Fetal State Assessment: Category I - tracings are normal.     Physical Exam  Constitutional: She is oriented to person, place, and time. She appears well-developed and well-nourished.  HENT:  Head: Normocephalic.  Cardiovascular: Normal rate, regular rhythm, normal heart sounds and intact distal pulses.  Respiratory: Effort normal and breath sounds normal.  GI: Soft.  Genitourinary: Vagina normal and uterus normal.  Musculoskeletal: Normal range of motion.  Neurological: She is alert and oriented to person, place, and time. She has normal reflexes.  Skin: Skin is warm and dry.  Psychiatric: She has a normal mood and affect. Her behavior is normal. Judgment and thought content normal.    Prenatal labs: ABO, Rh: --/--/A POS (09/01 0543) Antibody: NEG (09/01 0543) Rubella: 4.24 (02/20 1546) RPR: Non Reactive (06/27 0901)  HBsAg: Negative (02/20 1546)  HIV: Non Reactive (06/27 0901)  GBS:     Assessment/Plan: SOL active labor GBS neg Breast  feed  nexplanon as contraception Expect SVD  Koren Shiver 07/01/2018, 9:05 AM

## 2018-07-02 NOTE — Progress Notes (Signed)
Post Partum Day 1 Subjective: no complaints, up ad lib, voiding and tolerating PO  Objective: Blood pressure 98/68, pulse 63, temperature 98.9 F (37.2 C), resp. rate 16, last menstrual period 10/08/2017, SpO2 99 %, unknown if currently breastfeeding. Vitals:   07/01/18 1245 07/01/18 1641 07/01/18 2045 07/02/18 0508  BP: 111/66 92/71 104/63 98/68  Pulse: 88 73 65 63  Resp: 17  16 16   Temp: 99.8 F (37.7 C) 98.6 F (37 C) 98.7 F (37.1 C) 98.9 F (37.2 C)  TempSrc: Oral Oral Oral   SpO2:   99%     Physical Exam:  General: alert, cooperative and no distress Lochia: appropriate Uterine Fundus: firm Incision: n/a DVT Evaluation: No evidence of DVT seen on physical exam.  Recent Labs    07/01/18 0543  HGB 12.3  HCT 36.6    Assessment/Plan: Plan for discharge tomorrow and Breastfeeding   LOS: 1 day   Hansel Feinstein 07/02/2018, 6:38 AM

## 2018-07-02 NOTE — Lactation Note (Signed)
This note was copied from a baby's chart. Lactation Consultation Note  Patient Name: Girl Makinzy Cleere ZSWFU'X Date: 07/02/2018 Reason for consult: Follow-up assessment;Early term 30-38.6wks  P1 mother whose infant is now 50 hours old  Baby was sleeping and not showing feeding cues as I arrived.  Mother stated that baby is latching better today.  She is doing breast compressions with feeds and can hear baby swallowing.  Encouraged to continue feeding 8-12 times/24 hours or sooner if she shows cues.  Explained cluster feeding that may happen tonight.  Mother is still doing hand expression and colostrum container is at bedside.  She had some basic newborn concerns which I reviewed.  These included sleeping, voids/stools, feeding times and amounts and how to know if baby is getting enough.  Mother will call for assistance as needed.  Father present.   Maternal Data Formula Feeding for Exclusion: No Has patient been taught Hand Expression?: Yes Does the patient have breastfeeding experience prior to this delivery?: No  Feeding    LATCH Score                   Interventions    Lactation Tools Discussed/Used WIC Program: Yes   Consult Status Consult Status: Follow-up Date: 07/03/18 Follow-up type: In-patient    Zoe Nordin R Tarez Bowns 07/02/2018, 4:53 PM

## 2018-07-03 MED ORDER — IBUPROFEN 600 MG PO TABS: 600.0000 mg | ORAL_TABLET | Freq: Four times a day (QID) | ORAL | 0 refills | Status: DC

## 2018-07-03 NOTE — Discharge Summary (Signed)
OB Discharge Summary     Patient Name: Elizabeth Bird DOB: 07/18/1996 MRN: [id number]  Date of admission: 07/01/2018 Delivering MD: Koren Shiver D   Date of discharge: 07/03/2018  Admitting diagnosis: 38wks ctx 3 to 78mins apart Intrauterine pregnancy: [redacted]w[redacted]d     Secondary diagnosis:  Active Problems:   Labor and delivery, indication for care  Additional problems: none     Discharge diagnosis: Term Pregnancy Delivered                                                                                                Post partum procedures:none  Augmentation: AROM  Complications: None  Hospital course:  Onset of Labor With Vaginal Delivery     22 y.o. yo G3P1011 at [redacted]w[redacted]d was admitted in Active Labor on 07/01/2018. Patient had an uncomplicated labor course as follows:  Membrane Rupture Time/Date: 8:53 AM ,07/01/2018   Intrapartum Procedures: Episiotomy: None [1]                                         Lacerations:  None [1]  Patient had a delivery of a Viable infant. 07/01/2018  Information for the patient's newborn:  Dnyla, Antonetti [[id number]]  Delivery Method: Vaginal, Spontaneous(Filed from Delivery Summary)    Pateint had an uncomplicated postpartum course.  She is ambulating, tolerating a regular diet, passing flatus, and urinating well. Patient is discharged home in stable condition on 07/03/18.   Physical exam  Vitals:   07/02/18 0508 07/02/18 1531 07/02/18 2224 07/03/18 0500  BP: 98/68 97/77 (!) 91/53 96/61  Pulse: 63 (!) 129 62 65  Resp: 16 18 16 16   Temp: 98.9 F (37.2 C) 98 F (36.7 C) 98.9 F (37.2 C) 98.6 F (37 C)  TempSrc:  Oral Oral Oral  SpO2:  100%     General: alert, cooperative and no distress Lochia: appropriate Uterine Fundus: firm Incision: Healing well with no significant drainage DVT Evaluation: No evidence of DVT seen on physical exam. Labs: Lab Results  Component Value Date   WBC 9.6 07/01/2018   HGB 12.3 07/01/2018   HCT 36.6 07/01/2018    MCV 91.7 07/01/2018   PLT 311 07/01/2018   CMP Latest Ref Rng & Units 04/24/2017  Glucose 65 - 99 mg/dL 92  BUN 6 - 20 mg/dL 15  Creatinine 0.44 - 1.00 mg/dL 0.71  Sodium 135 - 145 mmol/L 138  Potassium 3.5 - 5.1 mmol/L 4.1  Chloride 101 - 111 mmol/L 105  CO2 22 - 32 mmol/L 27  Calcium 8.9 - 10.3 mg/dL 8.9  Total Protein 6.5 - 8.1 g/dL -  Total Bilirubin 0.3 - 1.2 mg/dL -  Alkaline Phos 38 - 126 U/L -  AST 15 - 41 U/L -  ALT 14 - 54 U/L -    Discharge instruction: per After Visit Summary and "Baby and Me Booklet".  After visit meds:  Allergies as of 07/03/2018      Reactions   Peroxide [hydrogen Peroxide] Hives  Medication List    TAKE these medications   ibuprofen 600 MG tablet Commonly known as:  ADVIL,MOTRIN Take 1 tablet (600 mg total) by mouth every 6 (six) hours.   prenatal multivitamin Tabs tablet Take 1 tablet by mouth daily at 12 noon.       Diet: routine diet  Activity: Advance as tolerated. Pelvic rest for 6 weeks.   Outpatient follow up:4 weeks Follow up Appt: Future Appointments  Date Time Provider [place]  07/05/2018  2:30 PM Jonnie Kind, MD [facility]   Follow up Visit:No follow-ups on file.  Postpartum contraception: Nexplanon  Newborn Data: Live born female  Birth Weight: 7 lb 13.6 oz (3560 g) APGAR: 9, 9  Newborn Delivery   Birth date/time:  07/01/2018 09:13:00 Delivery type:  Vaginal, Spontaneous     Baby Feeding: Breast Disposition:home with mother   07/03/2018 Hansel Feinstein, CNM

## 2018-07-03 NOTE — Lactation Note (Signed)
This note was copied from a baby's chart. Lactation Consultation Note  Patient Name: Elizabeth Bird [id number] Date: 07/03/2018 Reason for consult: Follow-up assessment;Infant weight loss;Primapara;1st time breastfeeding;Other (Comment)(3% weight loss , 32 hours Bili check 4.9 / milk is in )  Baby is 109 hours old  LC reviewed and updated the doc flow sheet per mom  Per mom baby recently breast fed , presently sound asleep.  Per mom mentioned her nipples are tender and her breast are achy.  LC offered to assess and mom receptive . LC noted both breast to be very full, Not engorged, and both nipples to be healthy in color and no breakdown noted.  LC reviewed hand expressing , and had mom return demo, with good results and  Flow. LC recommended prior to latch - breast massage, hand express, pre-pump if needed,  And has hand pump, ( LC instructed mom and also provided a large flange for when the milk comes  In , the #24 F is a good fit for today , LC checked. LC instructed mom on the use of comfort gels after feedings x 6 days.  Per mom has DEBP at home.  Mother informed of post-discharge support and given phone number to the lactation department, including services for phone call assistance; out-patient appointments; and breastfeeding support group. List of other breastfeeding resources in the community given in the handout. Encouraged mother to call for problems or concerns related to breastfeeding.    Maternal Data Has patient been taught Hand Expression?: Yes  Feeding Feeding Type: (recently fed ) Length of feed: 12 min(per mom )  LATCH Score ( Latch Score by the RN )  Latch: Grasps breast easily, tongue down, lips flanged, rhythmical sucking.(per mother)  Audible Swallowing: Spontaneous and intermittent  Type of Nipple: Everted at rest and after stimulation  Comfort (Breast/Nipple): Soft / non-tender  Hold (Positioning): No assistance needed to correctly position infant at  breast.  LATCH Score: 10  Interventions Interventions: Breast feeding basics reviewed;Comfort gels  Lactation Tools Discussed/Used Tools: Shells;Pump;Flanges Flange Size: 24;27 Shell Type: Inverted Breast pump type: Manual WIC Program: Yes Pump Review: Setup, frequency, and cleaning;Milk Storage Initiated by:: MAI  Date initiated:: 07/03/18   Consult Status Consult Status: Complete Date: 07/03/18    Jerlyn Ly Cynia Abruzzo 07/03/2018, 12:07 PM

## 2018-07-03 NOTE — Discharge Instructions (Signed)
Vaginal Delivery, Care After °Refer to this sheet in the next few weeks. These instructions provide you with information about caring for yourself after vaginal delivery. Your health care provider may also give you more specific instructions. Your treatment has been planned according to current medical practices, but problems sometimes occur. Call your health care provider if you have any problems or questions. °What can I expect after the procedure? °After vaginal delivery, it is common to have: °· Some bleeding from your vagina. °· Soreness in your abdomen, your vagina, and the area of skin between your vaginal opening and your anus (perineum). °· Pelvic cramps. °· Fatigue. ° °Follow these instructions at home: °Medicines °· Take over-the-counter and prescription medicines only as told by your health care provider. °· If you were prescribed an antibiotic medicine, take it as told by your health care provider. Do not stop taking the antibiotic until it is finished. °Driving ° °· Do not drive or operate heavy machinery while taking prescription pain medicine. °· Do not drive for 24 hours if you received a sedative. °Lifestyle °· Do not drink alcohol. This is especially important if you are breastfeeding or taking medicine to relieve pain. °· Do not use tobacco products, including cigarettes, chewing tobacco, or e-cigarettes. If you need help quitting, ask your health care provider. °Eating and drinking °· Drink at least 8 eight-ounce glasses of water every day unless you are told not to by your health care provider. If you choose to breastfeed your baby, you may need to drink more water than this. °· Eat high-fiber foods every day. These foods may help prevent or relieve constipation. High-fiber foods include: °? Whole grain cereals and breads. °? Brown rice. °? Beans. °? Fresh fruits and vegetables. °Activity °· Return to your normal activities as told by your health care provider. Ask your health care provider  what activities are safe for you. °· Rest as much as possible. Try to rest or take a nap when your baby is sleeping. °· Do not lift anything that is heavier than your baby or 10 lb (4.5 kg) until your health care provider says that it is safe. °· Talk with your health care provider about when you can engage in sexual activity. This may depend on your: °? Risk of infection. °? Rate of healing. °? Comfort and desire to engage in sexual activity. °Vaginal Care °· If you have an episiotomy or a vaginal tear, check the area every day for signs of infection. Check for: °? More redness, swelling, or pain. °? More fluid or blood. °? Warmth. °? Pus or a bad smell. °· Do not use tampons or douches until your health care provider says this is safe. °· Watch for any blood clots that may pass from your vagina. These may look like clumps of dark red, brown, or black discharge. °General instructions °· Keep your perineum clean and dry as told by your health care provider. °· Wear loose, comfortable clothing. °· Wipe from front to back when you use the toilet. °· Ask your health care provider if you can shower or take a bath. If you had an episiotomy or a perineal tear during labor and delivery, your health care provider may tell you not to take baths for a certain length of time. °· Wear a bra that supports your breasts and fits you well. °· If possible, have someone help you with household activities and help care for your baby for at least a few days after   you leave the hospital. °· Keep all follow-up visits for you and your baby as told by your health care provider. This is important. °Contact a health care provider if: °· You have: °? Vaginal discharge that has a bad smell. °? Difficulty urinating. °? Pain when urinating. °? A sudden increase or decrease in the frequency of your bowel movements. °? More redness, swelling, or pain around your episiotomy or vaginal tear. °? More fluid or blood coming from your episiotomy or  vaginal tear. °? Pus or a bad smell coming from your episiotomy or vaginal tear. °? A fever. °? A rash. °? Little or no interest in activities you used to enjoy. °? Questions about caring for yourself or your baby. °· Your episiotomy or vaginal tear feels warm to the touch. °· Your episiotomy or vaginal tear is separating or does not appear to be healing. °· Your breasts are painful, hard, or turn red. °· You feel unusually sad or worried. °· You feel nauseous or you vomit. °· You pass large blood clots from your vagina. If you pass a blood clot from your vagina, save it to show to your health care provider. Do not flush blood clots down the toilet without having your health care provider look at them. °· You urinate more than usual. °· You are dizzy or light-headed. °· You have not breastfed at all and you have not had a menstrual period for 12 weeks after delivery. °· You have stopped breastfeeding and you have not had a menstrual period for 12 weeks after you stopped breastfeeding. °Get help right away if: °· You have: °? Pain that does not go away or does not get better with medicine. °? Chest pain. °? Difficulty breathing. °? Blurred vision or spots in your vision. °? Thoughts about hurting yourself or your baby. °· You develop pain in your abdomen or in one of your legs. °· You develop a severe headache. °· You faint. °· You bleed from your vagina so much that you fill two sanitary pads in one hour. °This information is not intended to replace advice given to you by your health care provider. Make sure you discuss any questions you have with your health care provider. °Document Released: 10/14/2000 Document Revised: 03/30/2016 Document Reviewed: 11/01/2015 °Elsevier Interactive Patient Education © 2018 [employer] ° °

## 2018-07-05 ENCOUNTER — Encounter: Payer: Medicaid Other | Admitting: Obstetrics and Gynecology

## 2018-08-01 ENCOUNTER — Encounter: Payer: Self-pay | Admitting: Adult Health

## 2018-08-01 ENCOUNTER — Ambulatory Visit (INDEPENDENT_AMBULATORY_CARE_PROVIDER_SITE_OTHER): Payer: Medicaid Other | Admitting: Adult Health

## 2018-08-01 DIAGNOSIS — D1801 Hemangioma of skin and subcutaneous tissue: Secondary | ICD-10-CM | POA: Diagnosis not present

## 2018-08-01 DIAGNOSIS — Z30017 Encounter for initial prescription of implantable subdermal contraceptive: Secondary | ICD-10-CM | POA: Insufficient documentation

## 2018-08-01 NOTE — Progress Notes (Addendum)
Patient ID: Elizabeth Bird, female   DOB: 03/29/1996, 22 y.o.   MRN: [id number] [name] is a 22 year old white female in for her postpartum exam, she delivered at 78 +3 weeks, a baby girl, 7 lbs 13 oz.  Delivery Date: 07/01/18  Method of Delivery: Vaginal delivery by Daiva Nakayama, CNM,[facility]   Sexual Activity since delivery: No   Method of Feeding: Breastfeeding  Number of weeks bleeding post delivery: 4 weeks   Review of Systems: Patient denies any headaches, hearing loss, fatigue, blurred vision, shortness of breath, chest pain, abdominal pain, problems with bowel movements(hurts at times), urination, or intercourse(no sex yet). No joint pain or mood swings.  Reviewed past medical,surgical, social and family history. Reviewed medications and allergies.  Depression Score: 0  BP (!) 98/56 (BP Location: Right Arm, Patient Position: Sitting, Cuff Size: Normal)   Pulse (!) 50   Ht 5\' 3"  (1.6 m)   Wt 110 lb 3.2 oz (50 kg)   LMP 10/08/2017 (Approximate)   Breastfeeding? Yes   BMI 19.52 kg/m   Skin warm and dry.Multiple tattoos,and piercings, has rose colored hair today.  Neck: mid line trachea, normal thyroid, good ROM, no lymphadenopathy noted. Lungs: clear to ausculation bilaterally. 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 Pelvic Exam:   External genitalia is normal in appearance, no lesions.  The vagina has good color, moisture and rugae, no lesions.Urethra has no masses or tenderness noted. The cervix is bulbous.  Uterus is felt to be normal size, shape, and contour, well involuted.  No adnexal masses or tenderness noted.Bladder is non tender, no masses felt.No hemorrhoids seen externally.  Ok to sit in warm water in tub. Examination chaperoned by Estill Bamberg Rash LPN.   Impression:  Status post delivery, post partum check, depression screening, contraceptive management. Hemangioma on chest   Plan:    Will order nexplanon today No sex til after nexplanon inserted  Return in 1 week for hemangioma removal with Dr Glo Herring Return in about 2 1/2 weeks for nexplanon insertion

## 2018-08-01 NOTE — Patient Instructions (Signed)
No sex

## 2018-08-09 ENCOUNTER — Ambulatory Visit: Payer: Medicaid Other | Admitting: Obstetrics and Gynecology

## 2018-08-09 ENCOUNTER — Encounter: Payer: Self-pay | Admitting: Obstetrics and Gynecology

## 2018-08-09 VITALS — BP 96/52 | HR 52 | Ht 63.0 in | Wt 110.8 lb

## 2018-08-09 DIAGNOSIS — L929 Granulomatous disorder of the skin and subcutaneous tissue, unspecified: Secondary | ICD-10-CM

## 2018-08-09 NOTE — Progress Notes (Signed)
   Patient ID: Elizabeth Bird, female   DOB: 1996/07/24, 22 y.o.   MRN: [id number]  HPI: Pt has an inflammatory granuloma on her middle upper chest. It appeared 2.5 months ago, at the end of her pregnancy. She gave birth on 07/01/2018. The granuloma was bled at times, "ripped open" when she was in the hospital, or when burping baby on her chest. and has been growing since then. Pt notices more leaking when she has a band-aid on it. No adenopathy.  Assessment: 1) Inflammatory granuloma  2) Tied off at the base today, lidocaine with epi was used to numb the area., and a sub-cutaneous horizontal mattress stitch of 4-0 prolene placed deep to skin encircling the base of the granuloma.   Plan: 1) Return on Monday 08/13/2018 for removal of granuloma. Pt told to expect necrosis over the next 24-72 hours, to keep covered with neosporin, and to notify our office if there are concerns.   By signing my name below, I, De Burrs, attest that this documentation has been prepared under the direction and in the presence of Jonnie Kind, MD. Electronically Signed: De Burrs, Medical Scribe. 08/09/18. 11:51 AM.  I personally performed the services described in this documentation, which was SCRIBED in my presence. The recorded information has been reviewed and considered accurate. It has been edited as necessary during review. Jonnie Kind, MD

## 2018-08-13 ENCOUNTER — Ambulatory Visit: Payer: Medicaid Other | Admitting: Obstetrics and Gynecology

## 2018-08-13 ENCOUNTER — Encounter: Payer: Self-pay | Admitting: Obstetrics and Gynecology

## 2018-08-13 DIAGNOSIS — L929 Granulomatous disorder of the skin and subcutaneous tissue, unspecified: Secondary | ICD-10-CM

## 2018-08-13 NOTE — Progress Notes (Addendum)
Patient ID: Elizabeth Bird, female   DOB: 07-19-1996, 22 y.o.   MRN: [id number]    [facility] Visit  @DATE @            Patient name: Elizabeth Bird MRN [id number]  Date of birth: 1996/09/02  CC & HPI:  Elizabeth Bird is a 22 y.o. female presenting today for f/u for removal of pyogenic granuloma on chest. Lidocaine was used for removal. She syas that it is very sore and had concerns about it bleeding continuously over the weekend, about every 2 hours. She had to used 3 band-aids to keep it covered.   Had 8 drinks when she went off with her mother for her birthday and is concerned about when she can breastfeed again. Has been 2 days since she went drinking.  Advised if it has been 48 hours that she consumed alcohol, she can resume breast feeding  ROS:  ROS +pgyogenic granuloma anterior chest wall with silver nitrate applied to base to achieve hemostasis -fever -chills All systems are negative except as noted in the HPI and PMH.   Pertinent History Reviewed:   Reviewed:  Medical         Past Medical History:  Diagnosis Date   Medical history non-contributory                               Surgical Hx:    Past Surgical History:  Procedure Laterality Date   NO PAST SURGERIES     Medications: Reviewed & Updated - see associated section                       Current Outpatient Medications:    Prenatal Vit-Fe Fumarate-FA (PRENATAL MULTIVITAMIN) TABS tablet, Take 1 tablet by mouth daily at 12 noon., Disp: , Rfl:    ibuprofen (ADVIL,MOTRIN) 600 MG tablet, Take 1 tablet (600 mg total) by mouth every 6 (six) hours. (Patient not taking: Reported on 08/01/2018), Disp: 30 tablet, Rfl: 0   Social History: Reviewed -  reports that she has quit smoking. Her smoking use included cigarettes. She quit after 7.00 years of use. She has never used smokeless tobacco.  Objective Findings:  Vitals: Blood pressure 94/64, pulse (!) 43, height 5\' 3"  (1.6 m), weight 113 lb 9.6 oz (51.5 kg),  currently breastfeeding.  PHYSICAL EXAMINATION General appearance - alert, well appearing, and in no distress and oriented to person, place, and time Mental status - alert, oriented to person, place, and time, normal mood, behavior, speech, dress, motor activity, and thought processes, affect appropriate to mood  Skin - pyogenic granduloma on chest, which is become necrotic is hard firm and the stalk suture was very effective at causing devascularization of cutaneous horizontal mattress suture remains in place  PELVIC NOT DONE   Assessment & Plan:   A:  1. 1.5 x 1.5 x 1.5 cm pyogenic granuloma, excised under local anesthesia  P:  1.  A&D ointment once a day, Steri-Strips placed, Band-Aid to be changed daily 2. F/u PRN, or 10 days for suture removal    By signing my name below, I, Elizabeth Bird, attest that this documentation has been prepared under the direction and in the presence of Jonnie Kind, MD. Electronically Signed: [place]. 08/13/18. 9:27 AM.  I personally performed the services described in this documentation, which was SCRIBED in my presence. The recorded information has been reviewed and  considered accurate. It has been edited as necessary during review. Jonnie Kind, MD

## 2018-08-15 ENCOUNTER — Ambulatory Visit (INDEPENDENT_AMBULATORY_CARE_PROVIDER_SITE_OTHER): Payer: Medicaid Other | Admitting: Advanced Practice Midwife

## 2018-08-15 ENCOUNTER — Encounter: Payer: Self-pay | Admitting: Advanced Practice Midwife

## 2018-08-15 VITALS — BP 98/62 | HR 48 | Ht 63.0 in | Wt 112.0 lb

## 2018-08-15 DIAGNOSIS — Z3202 Encounter for pregnancy test, result negative: Secondary | ICD-10-CM

## 2018-08-15 DIAGNOSIS — Z3049 Encounter for surveillance of other contraceptives: Secondary | ICD-10-CM

## 2018-08-15 DIAGNOSIS — Z30017 Encounter for initial prescription of implantable subdermal contraceptive: Secondary | ICD-10-CM | POA: Insufficient documentation

## 2018-08-15 LAB — POCT URINE PREGNANCY: PREG TEST UR: NEGATIVE

## 2018-08-15 MED ORDER — ETONOGESTREL 68 MG ~~LOC~~ IMPL
68.0000 mg | DRUG_IMPLANT | Freq: Once | SUBCUTANEOUS | Status: AC
  Administered 2018-08-15: 68 mg via SUBCUTANEOUS

## 2018-08-15 NOTE — Progress Notes (Signed)
  HPI:  Elizabeth Bird is a 22 y.o. year old Caucasian female here for Nexplanon insertion.   and her pregnancy test today was negative.  Risks/benefits/side effects of Nexplanon have been discussed and her questions have been answered.  Specifically, a failure rate of 10/998 has been reported, with an increased failure rate if pt takes [place] and/or antiseizure medicaitons.  Elizabeth Bird is aware of the common side effect of irregular bleeding, which the incidence of decreases over time.   Past Medical History: Past Medical History:  Diagnosis Date  . Medical history non-contributory     Past Surgical History: Past Surgical History:  Procedure Laterality Date  . NO PAST SURGERIES      Family History: Family History  Problem Relation Age of Onset  . Multiple sclerosis Maternal Grandmother   . Arthritis Maternal Grandmother   . Suicidality Maternal Grandfather   . Multiple sclerosis Mother   . Thyroid disease Sister   . Schizophrenia Sister   . Bipolar disorder Sister   . Post-traumatic stress disorder Sister   . Asthma Maternal Aunt   . Cancer Maternal Aunt        colon    Social History: Social History   Tobacco Use  . Smoking status: Former Smoker    Years: 7.00    Types: Cigarettes  . Smokeless tobacco: Never Used  Substance Use Topics  . Alcohol use: Yes    Frequency: Never    Comment: occasionally; not now  . Drug use: No    Types: Marijuana    Comment: LAST use was Sept 2016    Allergies:  Allergies  Allergen Reactions  . Peroxide [Hydrogen Peroxide] Hives      Her left arm, approximatly 4 inches proximal from the elbow, was cleansed with alcohol and anesthetized with 2cc of 2% Lidocaine.  The area was cleansed again and the Nexplanon was inserted without difficulty.  A pressure bandage was applied.  Pt was instructed to remove pressure bandage in a few hours, and keep insertion site covered with a bandaid for 3 days.  Back up contraception  was recommended for 2 weeks.  Follow-up scheduled PRN problems  Christin Fudge 08/15/2018 11:01 AM

## 2018-08-15 NOTE — Addendum Note (Signed)
Addended by: Diona Fanti A on: 08/15/2018 11:12 AM   Modules accepted: Orders

## 2019-05-04 ENCOUNTER — Emergency Department (HOSPITAL_COMMUNITY)
Admission: EM | Admit: 2019-05-04 | Discharge: 2019-05-04 | Disposition: A | Discharge status: Home / Self Care | Payer: Medicaid Other | Attending: Emergency Medicine | Admitting: Emergency Medicine

## 2019-05-04 ENCOUNTER — Other Ambulatory Visit: Payer: Self-pay

## 2019-05-04 ENCOUNTER — Encounter (HOSPITAL_COMMUNITY): Payer: Self-pay | Admitting: Emergency Medicine

## 2019-05-04 DIAGNOSIS — J029 Acute pharyngitis, unspecified: Secondary | ICD-10-CM | POA: Insufficient documentation

## 2019-05-04 DIAGNOSIS — Z87891 Personal history of nicotine dependence: Secondary | ICD-10-CM | POA: Insufficient documentation

## 2019-05-04 LAB — GROUP A STREP BY PCR: Group A Strep by PCR: NOT DETECTED

## 2019-05-04 MED ORDER — CEFDINIR 300 MG PO CAPS: 300.0000 mg | ORAL_CAPSULE | Freq: Two times a day (BID) | ORAL | 0 refills | Status: DC

## 2019-05-04 NOTE — ED Triage Notes (Signed)
Pt c/o of sore throat, "white spots on tonsils" x 2 days

## 2019-05-04 NOTE — ED Provider Notes (Signed)
[facility] EMERGENCY DEPARTMENT Provider Note   CSN: [id number] Arrival date & time: 05/04/19  1111     History   Chief Complaint Chief Complaint  Patient presents with  . Sore Throat    HPI Elizabeth Bird is a 23 y.o. female.     The history is provided by the patient.  Sore Throat This is a new problem. The current episode started 2 days ago. The problem occurs constantly. The problem has not changed since onset.Pertinent negatives include no chest pain, no abdominal pain, no headaches and no shortness of breath. The symptoms are aggravated by swallowing (smoking). Nothing relieves the symptoms. She has tried nothing for the symptoms.    Past Medical History:  Diagnosis Date  . Medical history non-contributory     Patient Active Problem List   Diagnosis Date Noted  . Nexplanon insertion 08/15/2018  . Hemangioma of skin 08/01/2018  . Encounter for initial prescription of implantable subdermal contraceptive 08/01/2018  . Postpartum care and examination 08/01/2018  . Labor and delivery, indication for care 07/01/2018  . Underweight 12/20/2017    Past Surgical History:  Procedure Laterality Date  . NO PAST SURGERIES       OB History    Gravida  3   Para  2   Term  1   Preterm      AB  1   Living  1     SAB  1   TAB      Ectopic      Multiple  0   Live Births  1            Home Medications    Prior to Admission medications   Medication Sig Start Date End Date Taking? Authorizing Provider  ibuprofen (ADVIL,MOTRIN) 600 MG tablet Take 1 tablet (600 mg total) by mouth every 6 (six) hours. Patient not taking: Reported on 08/01/2018 07/03/18   Seabron Spates, CNM  Prenatal Vit-Fe Fumarate-FA (PRENATAL MULTIVITAMIN) TABS tablet Take 1 tablet by mouth daily at 12 noon.    [provider]    Family History Family History  Problem Relation Age of Onset  . Multiple sclerosis Maternal Grandmother   . Arthritis Maternal Grandmother   .  Suicidality Maternal Grandfather   . Multiple sclerosis Mother   . Thyroid disease Sister   . Schizophrenia Sister   . Bipolar disorder Sister   . Post-traumatic stress disorder Sister   . Asthma Maternal Aunt   . Cancer Maternal Aunt        colon    Social History Social History   Tobacco Use  . Smoking status: Former Smoker    Years: 7.00    Types: Cigarettes  . Smokeless tobacco: Never Used  Substance Use Topics  . Alcohol use: Yes    Frequency: Never    Comment: occasionally; not now  . Drug use: No    Types: Marijuana    Comment: LAST use was Sept 2016     Allergies   Peroxide [hydrogen peroxide]   Review of Systems Review of Systems  Constitutional: Negative for activity change and fever.       All ROS Neg except as noted in HPI  HENT: Positive for sore throat and trouble swallowing. Negative for nosebleeds.   Eyes: Negative for photophobia and discharge.  Respiratory: Negative for cough, shortness of breath and wheezing.   Cardiovascular: Negative for chest pain and palpitations.  Gastrointestinal: Negative for abdominal pain and blood in stool.  Genitourinary: Negative for dysuria, frequency and hematuria.  Musculoskeletal: Negative for arthralgias, back pain and neck pain.  Skin: Negative.  Negative for rash.  Neurological: Negative for dizziness, seizures, speech difficulty and headaches.  Psychiatric/Behavioral: Negative for confusion and hallucinations.     Physical Exam Updated Vital Signs Pulse 87   Temp 98.5 F (36.9 C) (Oral)   Resp 17   Ht 5\' 4"  (1.626 m)   Wt 40.8 kg   SpO2 99%   BMI 15.45 kg/m   Physical Exam Vitals signs and nursing note reviewed.  Constitutional:      Appearance: She is well-developed. She is not toxic-appearing.  HENT:     Head: Normocephalic.     Right Ear: Tympanic membrane and external ear normal.     Left Ear: Tympanic membrane and external ear normal.     Mouth/Throat:     Pharynx: Uvula midline.  Posterior oropharyngeal erythema and uvula swelling present.     Tonsils: Tonsillar exudate present.  Eyes:     General: Lids are normal.     Pupils: Pupils are equal, round, and reactive to light.  Neck:     Musculoskeletal: Normal range of motion and neck supple.     Vascular: No carotid bruit.  Cardiovascular:     Rate and Rhythm: Normal rate and regular rhythm.     Pulses: Normal pulses.     Heart sounds: Normal heart sounds.  Pulmonary:     Effort: No respiratory distress.     Breath sounds: Normal breath sounds.  Abdominal:     General: Bowel sounds are normal.     Palpations: Abdomen is soft.     Tenderness: There is no abdominal tenderness. There is no guarding.  Musculoskeletal: Normal range of motion.  Lymphadenopathy:     Head:     Right side of head: No submandibular adenopathy.     Left side of head: No submandibular adenopathy.     Cervical: No cervical adenopathy.  Skin:    General: Skin is warm and dry.  Neurological:     Mental Status: She is alert and oriented to person, place, and time.     Cranial Nerves: No cranial nerve deficit.     Sensory: No sensory deficit.  Psychiatric:        Speech: Speech normal.      ED Treatments / Results  Labs (all labs ordered are listed, but only abnormal results are displayed) Labs Reviewed  GROUP A STREP BY PCR    EKG None  Radiology No results found.  Procedures Procedures (including critical care time)  Medications Ordered in ED Medications - No data to display   Initial Impression / Assessment and Plan / ED Course  I have reviewed the triage vital signs and the nursing notes.  Pertinent labs & imaging results that were available during my care of the patient were reviewed by me and considered in my medical decision making (see chart for details).          Final Clinical Impressions(s) / ED Diagnoses MDM  Vital signs reviewed.  Pulse oximetry is 100% on room air.  Within normal limits by my  interpretation.  Examination favors an acute pharyngitis.  Patient will be treated with antibiotics.  Patient is asked to use a salt water gargles.  Patient is also asked to wash hands frequently, use a mask, and maintain social distancing.  Patient is to see the primary physician or return to the emergency department if  any changes in condition, worsening of symptoms, problems, or concerns.   Final diagnoses:  Pharyngitis, unspecified etiology    ED Discharge Orders         Ordered    cefdinir (OMNICEF) 300 MG capsule  2 times daily     05/04/19 1322           Lily Kocher, PA-C 05/05/19 1731    Francine Graven, DO 05/06/19 [phone]

## 2019-05-04 NOTE — Discharge Instructions (Addendum)
Please use salt water gargle several times a day.  Please use Omnicef 2 times daily with food.  Continue to use your mask and practice social distancing to prevent spread of the pharyngitis infection.  Please use another form of feeding her infant other than breast-feeding while on the antibiotic.  Use Tylenol every 4 hours for fever, headache, or aching.

## 2020-02-06 ENCOUNTER — Telehealth: Payer: Self-pay | Admitting: Women's Health

## 2020-02-06 NOTE — Telephone Encounter (Signed)
Pt states she has had the nexplanon for a year and half and she did not have a menstrual cycle until this February. And now she has one every month for a week. She would like to speak to a nurse regarding.

## 2020-02-06 NOTE — Telephone Encounter (Signed)
Pt has had Nexplanon for 1.5 years. In Feb, she started having bleeding, more than 1 period a month. No new sex partners. Not had sex x 5 months. Pt is breastfeeding. I mentioned Megace but pt wants to think about it and talk to her mom before starting any med. Advised to call back with any questions or concerns. Pt voiced understanding. [place]

## 2021-09-09 ENCOUNTER — Other Ambulatory Visit: Payer: Self-pay | Admitting: Obstetrics & Gynecology

## 2021-09-09 DIAGNOSIS — O3680X0 Pregnancy with inconclusive fetal viability, not applicable or unspecified: Secondary | ICD-10-CM

## 2021-09-10 ENCOUNTER — Other Ambulatory Visit: Payer: Self-pay

## 2021-09-10 ENCOUNTER — Ambulatory Visit (INDEPENDENT_AMBULATORY_CARE_PROVIDER_SITE_OTHER): Payer: Self-pay

## 2021-09-10 DIAGNOSIS — Z3A08 8 weeks gestation of pregnancy: Secondary | ICD-10-CM

## 2021-09-10 DIAGNOSIS — O3680X0 Pregnancy with inconclusive fetal viability, not applicable or unspecified: Secondary | ICD-10-CM

## 2021-09-10 NOTE — Progress Notes (Signed)
[place] 8 wks,single IUP with YS,positive FHT 162 bpm,CRL 16.94 mm,normal ovaries,left corpus luteum

## 2021-10-06 ENCOUNTER — Other Ambulatory Visit: Payer: Self-pay | Admitting: Obstetrics & Gynecology

## 2021-10-06 DIAGNOSIS — Z3682 Encounter for antenatal screening for nuchal translucency: Secondary | ICD-10-CM

## 2021-10-07 ENCOUNTER — Other Ambulatory Visit (HOSPITAL_COMMUNITY)
Admission: RE | Admit: 2021-10-07 | Discharge: 2021-10-07 | Disposition: A | Discharge status: Home / Self Care | Payer: Medicaid Other | Source: Ambulatory Visit | Attending: Women's Health | Admitting: Women's Health

## 2021-10-07 ENCOUNTER — Ambulatory Visit: Payer: Medicaid Other | Admitting: *Deleted

## 2021-10-07 ENCOUNTER — Encounter: Payer: Self-pay | Admitting: Women's Health

## 2021-10-07 ENCOUNTER — Other Ambulatory Visit: Payer: Self-pay

## 2021-10-07 ENCOUNTER — Ambulatory Visit (INDEPENDENT_AMBULATORY_CARE_PROVIDER_SITE_OTHER): Payer: Medicaid Other

## 2021-10-07 ENCOUNTER — Ambulatory Visit (INDEPENDENT_AMBULATORY_CARE_PROVIDER_SITE_OTHER): Payer: Medicaid Other | Admitting: Women's Health

## 2021-10-07 VITALS — BP 102/69 | HR 98 | Wt 94.0 lb

## 2021-10-07 DIAGNOSIS — Z3A11 11 weeks gestation of pregnancy: Secondary | ICD-10-CM

## 2021-10-07 DIAGNOSIS — Z113 Encounter for screening for infections with a predominantly sexual mode of transmission: Secondary | ICD-10-CM | POA: Diagnosis present

## 2021-10-07 DIAGNOSIS — Z124 Encounter for screening for malignant neoplasm of cervix: Secondary | ICD-10-CM

## 2021-10-07 DIAGNOSIS — Z3682 Encounter for antenatal screening for nuchal translucency: Secondary | ICD-10-CM

## 2021-10-07 DIAGNOSIS — Z3481 Encounter for supervision of other normal pregnancy, first trimester: Secondary | ICD-10-CM

## 2021-10-07 DIAGNOSIS — Z3480 Encounter for supervision of other normal pregnancy, unspecified trimester: Secondary | ICD-10-CM

## 2021-10-07 DIAGNOSIS — Z3490 Encounter for supervision of normal pregnancy, unspecified, unspecified trimester: Secondary | ICD-10-CM | POA: Insufficient documentation

## 2021-10-07 LAB — POCT URINALYSIS DIPSTICK OB
Blood, UA: NEGATIVE
Glucose, UA: NEGATIVE
Ketones, UA: NEGATIVE
Leukocytes, UA: NEGATIVE
Nitrite, UA: NEGATIVE
POC,PROTEIN,UA: NEGATIVE

## 2021-10-07 MED ORDER — BLOOD PRESSURE MONITOR MISC
0 refills | Status: DC
Start: 2021-10-07 — End: 2022-04-08

## 2021-10-07 NOTE — Progress Notes (Signed)
[place] 11+6 wks measurements c/w dates,CRL 57.96 mm,NB present,NT 1.7 mm,FHR 153 bpm,normal ovaries,fundal placenta

## 2021-10-07 NOTE — Progress Notes (Signed)
INITIAL OBSTETRICAL VISIT Patient name: Elizabeth Bird MRN [id number]  Date of birth: May 21, 1996 Chief Complaint:   Initial Prenatal Visit  History of Present Illness:   Elizabeth Bird is a 25 y.o. G6P1011 Caucasian female at [redacted]w[redacted]d by [place] at 8 weeks with an Estimated Date of Delivery: 04/22/22 being seen today for her initial obstetrical visit.   No LMP recorded (lmp unknown). Patient is pregnant. Her obstetrical history is significant for  16wk PPROM/SAB, then term uncomplicated SVB x 1 .   Today she reports no complaints. N/V improved Last pap never. Results were: N/A  Depression screen [facility] 2/9 10/07/2021 12/20/2017 12/04/2017  Decreased Interest 2 0 0  Down, Depressed, Hopeless 0 0 0  PHQ - 2 Score 2 0 0  Altered sleeping 1 1 -  Tired, decreased energy 1 1 -  Change in appetite 1 0 -  Feeling bad or failure about yourself  0 0 -  Trouble concentrating 0 0 -  Moving slowly or fidgety/restless 0 0 -  Suicidal thoughts 0 0 -  PHQ-9 Score 5 2 -     GAD 7 : Generalized Anxiety Score 10/07/2021  Nervous, Anxious, on Edge 0  Control/stop worrying 0  Worry too much - different things 0  Trouble relaxing 1  Restless 0  Easily annoyed or irritable 1  Afraid - awful might happen 0  Total GAD 7 Score 2     Review of Systems:   Pertinent items are noted in HPI Denies cramping/contractions, leakage of fluid, vaginal bleeding, abnormal vaginal discharge w/ itching/odor/irritation, headaches, visual changes, shortness of breath, chest pain, abdominal pain, severe nausea/vomiting, or problems with urination or bowel movements unless otherwise stated above.  Pertinent History Reviewed:  Reviewed past medical,surgical, social, obstetrical and family history.  Reviewed problem list, medications and allergies. OB History  Gravida Para Term Preterm AB Living  4 2 1   1 1   SAB IAB Ectopic Multiple Live Births  1     0 1    # Outcome Date GA Lbr Len/2nd Weight Sex Delivery Anes PTL Lv  4  Current           3 Term 07/01/18 [redacted]w[redacted]d 03:30 / 00:24 7 lb 13.6 oz (3.56 kg) F Vag-Spont EPI N LIV     Birth Comments: wnl  2 Para 12/06/15 [redacted]w[redacted]d  3.4 oz (0.096 kg) F Vag-Spont None  FD     Complications: Preterm premature rupture of membranes  1 SAB            Physical Assessment:   Vitals:   10/07/21 1346  BP: 102/69  Pulse: 98  Weight: 94 lb (42.6 kg)  Body mass index is 16.14 kg/m.       Physical Examination:  General appearance - well appearing, and in no distress  Mental status - alert, oriented to person, place, and time  Psych:  She has a normal mood and affect  Skin - warm and dry, normal color, no suspicious lesions noted  Chest - effort normal, all lung fields clear to auscultation bilaterally  Heart - normal rate and regular rhythm  Abdomen - soft, nontender  Extremities:  No swelling or varicosities noted  Pelvic - VULVA: normal appearing vulva with no masses, tenderness or lesions  VAGINA: normal appearing vagina with normal color and discharge, no lesions  CERVIX: normal appearing cervix without discharge or lesions, no CMT  Thin prep pap is done w/ reflex HR HPV cotesting  Chaperone: Marcie Bal  Young    TODAY'S NT [place] 11+6 wks measurements c/w dates,CRL 57.96 mm,NB present,NT 1.7 mm,FHR 153 bpm,normal ovaries,fundal placenta   Results for orders placed or performed in visit on 10/07/21 (from the past 24 hour(s))  POC Urinalysis Dipstick OB   Collection Time: 10/07/21  2:49 PM  Result Value Ref Range   Color, UA     Clarity, UA     Glucose, UA Negative Negative   Bilirubin, UA     Ketones, UA neg    Spec Grav, UA     Blood, UA neg    pH, UA     POC,PROTEIN,UA Negative Negative, Trace, Small (1+), Moderate (2+), Large (3+), 4+   Urobilinogen, UA     Nitrite, UA neg    Leukocytes, UA Negative Negative   Appearance     Odor      Assessment & Plan:  1) Low-Risk Pregnancy G4P1011 at [redacted]w[redacted]d with an Estimated Date of Delivery: 04/22/22   2) Initial OB  visit  3) H/O 16wk PPROM/SAB  Meds:  Meds ordered this encounter  Medications   Blood Pressure Monitor MISC    Sig: For regular home bp monitoring during pregnancy    Dispense:  1 each    Refill:  0    Z34.81 Please mail to patient    Initial labs obtained Continue prenatal vitamins Reviewed n/v relief measures and warning s/s to report Reviewed recommended weight gain based on pre-gravid BMI Encouraged well-balanced diet Genetic & carrier screening discussed: requests Panorama, NT/IT, and Horizon  Ultrasound discussed; fetal survey: requested CCNC completed> form faxed if has or is planning to apply for medicaid The nature of [employer] for [employer] with multiple MDs and other Advanced Practice Providers was explained to patient; also emphasized that fellows, residents, and students are part of our team. Does not have home bp cuff. Office bp cuff given: no. Rx sent: yes. Check bp weekly, let us know if consistently >140/90.   Follow-up: Return in about 4 weeks (around 11/04/2021) for [place], 2nd IT, CNM, in person.   Orders Placed This Encounter  Procedures   Urine Culture   Integrated 1   Genetic Screening   Pain Management Screening Profile (10S)   CBC/D/Plt+RPR+Rh+ABO+RubIgG...   POC Urinalysis Dipstick OB    Roma Schanz CNM, [facility] 10/07/2021 2:52 PM

## 2021-10-07 NOTE — Patient Instructions (Signed)
Elizabeth Bird, thank you for choosing our office today! We appreciate the opportunity to meet your healthcare needs. You may receive a short survey by mail, e-mail, or through [employer]. If you are happy with your care we would appreciate if you could take just a few minutes to complete the survey questions. We read all of your comments and take your feedback very seriously. Thank you again for choosing our office.  [facility] at [place] at [facility] ([place], [place] [address]) Entrance C, located off of [address] parking   Nausea & Vomiting Have saltine crackers or pretzels by your bed and eat a few bites before you raise your head out of bed in the morning Eat small frequent meals throughout the day instead of large meals Drink plenty of fluids throughout the day to stay hydrated, just don't drink a lot of fluids with your meals.  This can make your stomach fill up faster making you feel sick Do not brush your teeth right after you eat Products with real ginger are good for nausea, like ginger ale and ginger hard candy Make sure it says made with real ginger! Sucking on sour candy like lemon heads is also good for nausea If your prenatal vitamins make you nauseated, take them at night so you will sleep through the nausea Sea Bands If you feel like you need medicine for the nausea & vomiting please let us know If you are unable to keep any fluids or food down please let us know   Constipation Drink plenty of fluid, preferably water, throughout the day Eat foods high in fiber such as fruits, vegetables, and grains Exercise, such as walking, is a good way to keep your bowels regular Drink warm fluids, especially warm prune juice, or decaf coffee Eat a 1/2 cup of real oatmeal (not instant), 1/2 cup applesauce, and 1/2-1 cup warm prune juice every day If needed, you may take Colace (docusate sodium) stool softener  once or twice a day to help keep the stool soft.  If you still are having problems with constipation, you may take Miralax once daily as needed to help keep your bowels regular.   Home Blood Pressure Monitoring for Patients   Your provider has recommended that you check your blood pressure (BP) at least once a week at home. If you do not have a blood pressure cuff at home, one will be provided for you. Contact your provider if you have not received your monitor within 1 week.   Helpful Tips for Accurate Home Blood Pressure Checks  Don't smoke, exercise, or drink caffeine 30 minutes before checking your BP Use the restroom before checking your BP (a full bladder can raise your pressure) Relax in a comfortable upright chair Feet on the ground Left arm resting comfortably on a flat surface at the level of your heart Legs uncrossed Back supported Sit quietly and don't talk Place the cuff on your bare arm Adjust snuggly, so that only two fingertips can fit between your skin and the top of the cuff Check 2 readings separated by at least one minute Keep a log of your BP readings For a visual, please reference this diagram: http://ccnc.care/bpdiagram  Provider Name: [facility]     Phone: [phone]  Zone 1: ALL CLEAR  Continue to monitor your symptoms:  BP reading is less than 140 (top number) or less than 90 (bottom  number)  No right upper stomach pain No headaches or seeing spots No feeling nauseated or throwing up No swelling in face and hands  Zone 2: CAUTION Call your doctor's office for any of the following:  BP reading is greater than 140 (top number) or greater than 90 (bottom number)  Stomach pain under your ribs in the middle or right side Headaches or seeing spots Feeling nauseated or throwing up Swelling in face and hands  Zone 3: EMERGENCY  Seek immediate medical care if you have any of the following:  BP reading is greater than160 (top number) or greater than  110 (bottom number) Severe headaches not improving with Tylenol Serious difficulty catching your breath Any worsening symptoms from Zone 2    First Trimester of Pregnancy The first trimester of pregnancy is from week 1 until the end of week 12 (months 1 through 3). A week after a sperm fertilizes an egg, the egg will implant on the wall of the uterus. This embryo will begin to develop into a baby. Genes from you and your partner are forming the baby. The female genes determine whether the baby is a boy or a girl. At 6-8 weeks, the eyes and face are formed, and the heartbeat can be seen on ultrasound. At the end of 12 weeks, all the baby's organs are formed.  Now that you are pregnant, you will want to do everything you can to have a healthy baby. Two of the most important things are to get good prenatal care and to follow your health care provider's instructions. Prenatal care is all the medical care you receive before the baby's birth. This care will help prevent, find, and treat any problems during the pregnancy and childbirth. BODY CHANGES Your body goes through many changes during pregnancy. The changes vary from woman to woman.  You may gain or lose a couple of pounds at first. You may feel sick to your stomach (nauseous) and throw up (vomit). If the vomiting is uncontrollable, call your health care provider. You may tire easily. You may develop headaches that can be relieved by medicines approved by your health care provider. You may urinate more often. Painful urination may mean you have a bladder infection. You may develop heartburn as a result of your pregnancy. You may develop constipation because certain hormones are causing the muscles that push waste through your intestines to slow down. You may develop hemorrhoids or swollen, bulging veins (varicose veins). Your breasts may begin to grow larger and become tender. Your nipples may stick out more, and the tissue that surrounds them  (areola) may become darker. Your gums may bleed and may be sensitive to brushing and flossing. Dark spots or blotches (chloasma, mask of pregnancy) may develop on your face. This will likely fade after the baby is born. Your menstrual periods will stop. You may have a loss of appetite. You may develop cravings for certain kinds of food. You may have changes in your emotions from day to day, such as being excited to be pregnant or being concerned that something may go wrong with the pregnancy and baby. You may have more vivid and strange dreams. You may have changes in your hair. These can include thickening of your hair, rapid growth, and changes in texture. Some women also have hair loss during or after pregnancy, or hair that feels dry or thin. Your hair will most likely return to normal after your baby is born. WHAT TO EXPECT AT YOUR PRENATAL  VISITS During a routine prenatal visit: You will be weighed to make sure you and the baby are growing normally. Your blood pressure will be taken. Your abdomen will be measured to track your baby's growth. The fetal heartbeat will be listened to starting around week 10 or 12 of your pregnancy. Test results from any previous visits will be discussed. Your health care provider may ask you: How you are feeling. If you are feeling the baby move. If you have had any abnormal symptoms, such as leaking fluid, bleeding, severe headaches, or abdominal cramping. If you have any questions. Other tests that may be performed during your first trimester include: Blood tests to find your blood type and to check for the presence of any previous infections. They will also be used to check for low iron levels (anemia) and Rh antibodies. Later in the pregnancy, blood tests for diabetes will be done along with other tests if problems develop. Urine tests to check for infections, diabetes, or protein in the urine. An ultrasound to confirm the proper growth and development  of the baby. An amniocentesis to check for possible genetic problems. Fetal screens for spina bifida and Down syndrome. You may need other tests to make sure you and the baby are doing well. HOME CARE INSTRUCTIONS  Medicines Follow your health care provider's instructions regarding medicine use. Specific medicines may be either safe or unsafe to take during pregnancy. Take your prenatal vitamins as directed. If you develop constipation, try taking a stool softener if your health care provider approves. Diet Eat regular, well-balanced meals. Choose a variety of foods, such as meat or vegetable-based protein, fish, milk and low-fat dairy products, vegetables, fruits, and whole grain breads and cereals. Your health care provider will help you determine the amount of weight gain that is right for you. Avoid raw meat and uncooked cheese. These carry germs that can cause birth defects in the baby. Eating four or five small meals rather than three large meals a day may help relieve nausea and vomiting. If you start to feel nauseous, eating a few soda crackers can be helpful. Drinking liquids between meals instead of during meals also seems to help nausea and vomiting. If you develop constipation, eat more high-fiber foods, such as fresh vegetables or fruit and whole grains. Drink enough fluids to keep your urine clear or pale yellow. Activity and Exercise Exercise only as directed by your health care provider. Exercising will help you: Control your weight. Stay in shape. Be prepared for labor and delivery. Experiencing pain or cramping in the lower abdomen or low back is a good sign that you should stop exercising. Check with your health care provider before continuing normal exercises. Try to avoid standing for long periods of time. Move your legs often if you must stand in one place for a long time. Avoid heavy lifting. Wear low-heeled shoes, and practice good posture. You may continue to have sex  unless your health care provider directs you otherwise. Relief of Pain or Discomfort Wear a good support bra for breast tenderness.   Take warm sitz baths to soothe any pain or discomfort caused by hemorrhoids. Use hemorrhoid cream if your health care provider approves.   Rest with your legs elevated if you have leg cramps or low back pain. If you develop varicose veins in your legs, wear support hose. Elevate your feet for 15 minutes, 3-4 times a day. Limit salt in your diet. Prenatal Care Schedule your prenatal visits by the  twelfth week of pregnancy. They are usually scheduled monthly at first, then more often in the last 2 months before delivery. Write down your questions. Take them to your prenatal visits. Keep all your prenatal visits as directed by your health care provider. Safety Wear your seat belt at all times when driving. Make a list of emergency phone numbers, including numbers for family, friends, the hospital, and police and fire departments. General Tips Ask your health care provider for a referral to a local prenatal education class. Begin classes no later than at the beginning of month 6 of your pregnancy. Ask for help if you have counseling or nutritional needs during pregnancy. Your health care provider can offer advice or refer you to specialists for help with various needs. Do not use hot tubs, steam rooms, or saunas. Do not douche or use tampons or scented sanitary pads. Do not cross your legs for long periods of time. Avoid cat litter boxes and soil used by cats. These carry germs that can cause birth defects in the baby and possibly loss of the fetus by miscarriage or stillbirth. Avoid all smoking, herbs, alcohol, and medicines not prescribed by your health care provider. Chemicals in these affect the formation and growth of the baby. Schedule a dentist appointment. At home, brush your teeth with a soft toothbrush and be gentle when you floss. SEEK MEDICAL CARE IF:   You have dizziness. You have mild pelvic cramps, pelvic pressure, or nagging pain in the abdominal area. You have persistent nausea, vomiting, or diarrhea. You have a bad smelling vaginal discharge. You have pain with urination. You notice increased swelling in your face, hands, legs, or ankles. SEEK IMMEDIATE MEDICAL CARE IF:  You have a fever. You are leaking fluid from your vagina. You have spotting or bleeding from your vagina. You have severe abdominal cramping or pain. You have rapid weight gain or loss. You vomit blood or material that looks like coffee grounds. You are exposed to [place] measles and have never had them. You are exposed to fifth disease or chickenpox. You develop a severe headache. You have shortness of breath. You have any kind of trauma, such as from a fall or a car accident. Document Released: 10/11/2001 Document Revised: 03/03/2014 Document Reviewed: 08/27/2013 [facility] Patient Information 2015 [place], [place]. This information is not intended to replace advice given to you by your health care provider. Make sure you discuss any questions you have with your health care provider.

## 2021-10-08 LAB — MED LIST OPTION NOT SELECTED

## 2021-10-09 LAB — INTEGRATED 1
Crown Rump Length: 58 mm
Gest. Age on Collection Date: 12.1 weeks
Maternal Age at EDD: 25 yr
Nuchal Translucency (NT): 1.7 mm
Number of Fetuses: 1
PAPP-A Value: 2246.4 ng/mL
Weight: 94 [lb_av]

## 2021-10-09 LAB — CBC/D/PLT+RPR+RH+ABO+RUBIGG...
Antibody Screen: NEGATIVE
Basophils Absolute: 0 10*3/uL (ref 0.0–0.2)
Basos: 0 %
EOS (ABSOLUTE): 0.2 10*3/uL (ref 0.0–0.4)
Eos: 1 %
HCV Ab: <0.1 s/co ratio (ref 0.0–0.9)
HIV Screen 4th Generation wRfx: NONREACTIVE
Hematocrit: 34.3 % (ref 34.0–46.6)
Hemoglobin: 11.4 g/dL (ref 11.1–15.9)
Hepatitis B Surface Ag: NEGATIVE
Immature Grans (Abs): 0.1 10*3/uL (ref 0.0–0.1)
Immature Granulocytes: 1 %
Lymphocytes Absolute: 2.6 10*3/uL (ref 0.7–3.1)
Lymphs: 19 %
MCH: 30.4 pg (ref 26.6–33.0)
MCHC: 33.2 g/dL (ref 31.5–35.7)
MCV: 92 fL (ref 79–97)
Monocytes Absolute: 0.7 10*3/uL (ref 0.1–0.9)
Monocytes: 5 %
Neutrophils Absolute: 10.1 10*3/uL — ABNORMAL HIGH (ref 1.4–7.0)
Neutrophils: 74 %
Platelets: 328 10*3/uL (ref 150–450)
RBC: 3.75 x10E6/uL — ABNORMAL LOW (ref 3.77–5.28)
RDW: 12.9 % (ref 11.7–15.4)
RPR Ser Ql: NONREACTIVE
Rh Factor: POSITIVE
Rubella Antibodies, IGG: 3.22 index (ref >0.99)
WBC: 13.7 10*3/uL — ABNORMAL HIGH (ref 3.4–10.8)

## 2021-10-09 LAB — HCV INTERPRETATION

## 2021-10-09 LAB — URINE CULTURE: Organism ID, Bacteria: NO GROWTH

## 2021-10-11 LAB — PMP SCREEN PROFILE (10S), URINE
Amphetamine Scrn, Ur: NEGATIVE ng/mL
BARBITURATE SCREEN URINE: NEGATIVE ng/mL
BENZODIAZEPINE SCREEN, URINE: NEGATIVE ng/mL
CANNABINOIDS UR QL SCN: NEGATIVE ng/mL
Cocaine (Metab) Scrn, Ur: NEGATIVE ng/mL
Creatinine(Crt), U: 199.3 mg/dL (ref 20.0–300.0)
Methadone Screen, Urine: NEGATIVE ng/mL
OXYCODONE+OXYMORPHONE UR QL SCN: NEGATIVE ng/mL
Opiate Scrn, Ur: NEGATIVE ng/mL
Ph of Urine: 5.7 (ref 4.5–8.9)
Phencyclidine Qn, Ur: NEGATIVE ng/mL
Propoxyphene Scrn, Ur: NEGATIVE ng/mL

## 2021-10-11 LAB — CYTOLOGY - PAP
Chlamydia: NEGATIVE
Comment: NEGATIVE
Comment: NORMAL
Diagnosis: NEGATIVE
Neisseria Gonorrhea: NEGATIVE

## 2021-10-18 ENCOUNTER — Encounter: Payer: Self-pay | Admitting: Women's Health

## 2021-10-31 NOTE — L&D Delivery Note (Signed)
OB/GYN Faculty Practice Delivery Note  Elizabeth Bird is a 26 y.o. G4P1011 s/p vag del at [redacted]w[redacted]d She was admitted for latent labor.   ROM: 0h 356mith clear fluid GBS Status: neg Maximum Maternal Temperature: 98.4  Labor Progress: Ms HoEcklundrrived to L&D at 5cm and progressed spontaneously to complete/delivery.   Delivery Date/Time: June 8th, 2023 at 0503 Delivery: Called to room and patient was complete and pushing. Head delivered ROA. No nuchal cord present. Shoulder and body delivered in usual fashion. Infant with spontaneous cry, placed on mother's abdomen, dried and stimulated. Cord clamped x 2 after 1-minute delay, and cut by FOB. Cord blood drawn. Placenta delivered spontaneously with gentle cord traction. Fundus firm with massage and Pitocin. Labia, perineum, vagina, and cervix inspected and found to be intact.   Placenta: spont, intact; to L&D Complications: none Lacerations: none EBL: 200cc Analgesia: epidural  Postpartum Planning '[x]'$  message to sent to schedule follow-up   Infant: boy  APGARs 9/9  3380g (7lb 7.2oz)  KiMyrtis SerCNM  04/07/2022 5:32 AM

## 2021-11-05 ENCOUNTER — Other Ambulatory Visit: Payer: Self-pay

## 2021-11-05 ENCOUNTER — Ambulatory Visit (INDEPENDENT_AMBULATORY_CARE_PROVIDER_SITE_OTHER): Payer: Medicaid Other | Admitting: Advanced Practice Midwife

## 2021-11-05 VITALS — BP 107/71 | HR 105 | Wt 101.0 lb

## 2021-11-05 DIAGNOSIS — Z363 Encounter for antenatal screening for malformations: Secondary | ICD-10-CM

## 2021-11-05 DIAGNOSIS — Z3A16 16 weeks gestation of pregnancy: Secondary | ICD-10-CM

## 2021-11-05 DIAGNOSIS — Z1379 Encounter for other screening for genetic and chromosomal anomalies: Secondary | ICD-10-CM

## 2021-11-05 DIAGNOSIS — Z3480 Encounter for supervision of other normal pregnancy, unspecified trimester: Secondary | ICD-10-CM

## 2021-11-05 NOTE — Progress Notes (Signed)
° °  LOW-RISK PREGNANCY VISIT Patient name: Elizabeth Bird MRN [id number]  Date of birth: [birth year] Chief Complaint:   Routine Prenatal Visit  History of Present Illness:   Elizabeth Bird is a 26 y.o. G51P1011 female at [redacted]w[redacted]d with an Estimated Date of Delivery: 04/22/22 being seen today for ongoing management of a low-risk pregnancy.  Today she reports no complaints. Contractions: Not present. Vag. Bleeding: None.   . denies leaking of fluid. Review of Systems:   Pertinent items are noted in HPI Denies abnormal vaginal discharge w/ itching/odor/irritation, headaches, visual changes, shortness of breath, chest pain, abdominal pain, severe nausea/vomiting, or problems with urination or bowel movements unless otherwise stated above. Pertinent History Reviewed:  Reviewed past medical,surgical, social, obstetrical and family history.  Reviewed problem list, medications and allergies. Physical Assessment:   Vitals:   11/05/21 1008  BP: 107/71  Pulse: (!) 105  Weight: 101 lb (45.8 kg)  Body mass index is 17.34 kg/m.        Physical Examination:   General appearance: Well appearing, and in no distress  Mental status: Alert, oriented to person, place, and time  Skin: Warm & dry  Cardiovascular: Normal heart rate noted  Respiratory: Normal respiratory effort, no distress  Abdomen: Soft, gravid, nontender  Pelvic: Cervical exam deferred         Extremities: Edema: None  Fetal Status: Fetal Heart Rate (bpm): 154        No results found for this or any previous visit (from the past 24 hour(s)).  Assessment & Plan:  1) Low-risk pregnancy G4P1011 at [redacted]w[redacted]d with an Estimated Date of Delivery: 04/22/22   2) Wants delayed cord clamping, note made about waiting until cord has stopped pulsing   Meds: No orders of the defined types were placed in this encounter.  Labs/procedures today: 2nd IT  Plan:  Continue routine obstetrical care   Reviewed: Preterm labor symptoms and general obstetric  precautions including but not limited to vaginal bleeding, contractions, leaking of fluid and fetal movement were reviewed in detail with the patient.  All questions were answered. Didn't ask about home bp cuff. Check bp weekly, let us know if >140/90.   Follow-up: Return in about 4 weeks (around 12/03/2021) for [place], [place]: Anatomy, in person.  Orders Placed This Encounter  Procedures   US OB Comp + 14 Wk   [facility] [place] 11/05/2021 10:28 AM

## 2021-11-05 NOTE — Patient Instructions (Signed)
[place], thank you for choosing our office today! We appreciate the opportunity to meet your healthcare needs. You may receive a short survey by mail, e-mail, or through [employer]. If you are happy with your care we would appreciate if you could take just a few minutes to complete the survey questions. We read all of your comments and take your feedback very seriously. Thank you again for choosing our office.  [facility] [employer] Team at [place] at [facility] ([place], [place] [address]) Entrance C, located off of [address] [place] parking  Go to [email or web address] to register for FREE online childbirth classes  Call the office [phone]) or go to [facility] if: You begin to severe cramping Your water breaks.  Sometimes it is a big gush of fluid, sometimes it is just a trickle that keeps getting your panties wet or running down your legs You have vaginal bleeding.  It is normal to have a small amount of spotting if your cervix was checked.   [facility] Pediatricians/Family Doctors [facility]): [address] [place], [facility]: [address] [place], [phone]                [employer]): [place], [phone] (call to ask if accepting patients) [facility] Department: [address], [place], [place] Pediatricians/Family Doctors [facility]): [place]. [place], [address], [facility]: [address] [place], [place] [facility] [place]: [place], [place] [facility]): [phone] [facility]: [address], [place]: [address], [place] [facility]: [phone], [phone]  Home Blood Pressure Monitoring for Patients   Your provider has recommended that you check your blood pressure (BP) at least once a week at home. If you do not have a blood pressure cuff at home, one will be provided for you. Contact your provider if you have not received your monitor within 1 week.   Helpful Tips for Accurate Home Blood Pressure Checks  Don't smoke, exercise, or drink caffeine 30 minutes before checking your BP Use the restroom before checking your BP (a full bladder can raise your pressure) Relax in a comfortable upright chair Feet on the ground Left arm resting comfortably on a flat surface at the level of your heart Legs uncrossed Back supported Sit quietly and don't talk Place the cuff on your bare arm Adjust snuggly, so that only two fingertips can fit between your skin and the top of the cuff Check 2 readings separated by at least one minute Keep a log of your BP readings For a visual, please reference this diagram: http://ccnc.care/bpdiagram  Provider Name: [facility]     Phone: [phone]  Zone 1: ALL CLEAR  Continue to monitor your symptoms:  BP reading is less than 140 (top number) or less than 90 (bottom number)  No right upper stomach pain No headaches or seeing spots No feeling nauseated or throwing up No swelling in face and hands  Zone 2: CAUTION Call your doctor's office for any of the following:  BP reading is greater than 140 (top number) or greater than  90 (bottom number)  Stomach pain under your ribs in the middle or right side Headaches or seeing spots Feeling nauseated or throwing up Swelling in face and hands  Zone 3: EMERGENCY  Seek immediate medical care if you have any of the following:  BP reading is greater than160 (top number) or greater than 110 (bottom number) Severe headaches not improving with Tylenol Serious difficulty catching your breath Any worsening symptoms from  Zone 2     Second Trimester of Pregnancy The second trimester is from week 14 through week 27 (months 4 through 6). The second trimester is often a time when you feel your best. Your body has adjusted to being pregnant, and you begin to feel better physically. Usually, morning sickness has lessened or quit completely, you may have more energy, and you may have an increase in appetite. The second trimester is also a time when the fetus is growing rapidly. At the end of the sixth month, the fetus is about 9 inches long and weighs about 1 pounds. You will likely begin to feel the baby move (quickening) between 16 and 20 weeks of pregnancy. Body changes during your second trimester Your body continues to go through many changes during your second trimester. The changes vary from woman to woman. Your weight will continue to increase. You will notice your lower abdomen bulging out. You may begin to get stretch marks on your hips, abdomen, and breasts. You may develop headaches that can be relieved by medicines. The medicines should be approved by your health care provider. You may urinate more often because the fetus is pressing on your bladder. You may develop or continue to have heartburn as a result of your pregnancy. You may develop constipation because certain hormones are causing the muscles that push waste through your intestines to slow down. You may develop hemorrhoids or swollen, bulging veins (varicose veins). You may have back pain. This is caused by: Weight gain. Pregnancy hormones that are relaxing the joints in your pelvis. A shift in weight and the muscles that support your balance. Your breasts will continue to grow and they will continue to become tender. Your gums may bleed and may be sensitive to brushing and flossing. Dark spots or blotches (chloasma, mask of pregnancy) may develop on your face. This will likely fade after the baby is born. A dark line from your belly button to  the pubic area (linea nigra) may appear. This will likely fade after the baby is born. You may have changes in your hair. These can include thickening of your hair, rapid growth, and changes in texture. Some women also have hair loss during or after pregnancy, or hair that feels dry or thin. Your hair will most likely return to normal after your baby is born.  What to expect at prenatal visits During a routine prenatal visit: You will be weighed to make sure you and the fetus are growing normally. Your blood pressure will be taken. Your abdomen will be measured to track your baby's growth. The fetal heartbeat will be listened to. Any test results from the previous visit will be discussed.  Your health care provider may ask you: How you are feeling. If you are feeling the baby move. If you have had any abnormal symptoms, such as leaking fluid, bleeding, severe headaches, or abdominal cramping. If you are using any tobacco products, including cigarettes, chewing tobacco, and electronic cigarettes. If you have any questions.  Other tests that may be performed during   your second trimester include: Blood tests that check for: Low iron levels (anemia). High blood sugar that affects pregnant women (gestational diabetes) between 24 and 28 weeks. Rh antibodies. This is to check for a protein on red blood cells (Rh factor). Urine tests to check for infections, diabetes, or protein in the urine. An ultrasound to confirm the proper growth and development of the baby. An amniocentesis to check for possible genetic problems. Fetal screens for spina bifida and Down syndrome. HIV (human immunodeficiency virus) testing. Routine prenatal testing includes screening for HIV, unless you choose not to have this test.  Follow these instructions at home: Medicines Follow your health care provider's instructions regarding medicine use. Specific medicines may be either safe or unsafe to take during  pregnancy. Take a prenatal vitamin that contains at least 600 micrograms (mcg) of folic acid. If you develop constipation, try taking a stool softener if your health care provider approves. Eating and drinking Eat a balanced diet that includes fresh fruits and vegetables, whole grains, good sources of protein such as meat, eggs, or tofu, and low-fat dairy. Your health care provider will help you determine the amount of weight gain that is right for you. Avoid raw meat and uncooked cheese. These carry germs that can cause birth defects in the baby. If you have low calcium intake from food, talk to your health care provider about whether you should take a daily calcium supplement. Limit foods that are high in fat and processed sugars, such as fried and sweet foods. To prevent constipation: Drink enough fluid to keep your urine clear or pale yellow. Eat foods that are high in fiber, such as fresh fruits and vegetables, whole grains, and beans. Activity Exercise only as directed by your health care provider. Most women can continue their usual exercise routine during pregnancy. Try to exercise for 30 minutes at least 5 days a week. Stop exercising if you experience uterine contractions. Avoid heavy lifting, wear low heel shoes, and practice good posture. A sexual relationship may be continued unless your health care provider directs you otherwise. Relieving pain and discomfort Wear a good support bra to prevent discomfort from breast tenderness. Take warm sitz baths to soothe any pain or discomfort caused by hemorrhoids. Use hemorrhoid cream if your health care provider approves. Rest with your legs elevated if you have leg cramps or low back pain. If you develop varicose veins, wear support hose. Elevate your feet for 15 minutes, 3-4 times a day. Limit salt in your diet. Prenatal Care Write down your questions. Take them to your prenatal visits. Keep all your prenatal visits as told by your health  care provider. This is important. Safety Wear your seat belt at all times when driving. Make a list of emergency phone numbers, including numbers for family, friends, the hospital, and police and fire departments. General instructions Ask your health care provider for a referral to a local prenatal education class. Begin classes no later than the beginning of month 6 of your pregnancy. Ask for help if you have counseling or nutritional needs during pregnancy. Your health care provider can offer advice or refer you to specialists for help with various needs. Do not use hot tubs, steam rooms, or saunas. Do not douche or use tampons or scented sanitary pads. Do not cross your legs for long periods of time. Avoid cat litter boxes and soil used by cats. These carry germs that can cause birth defects in the baby and possibly loss of the   fetus by miscarriage or stillbirth. Avoid all smoking, herbs, alcohol, and unprescribed drugs. Chemicals in these products can affect the formation and growth of the baby. Do not use any products that contain nicotine or tobacco, such as cigarettes and e-cigarettes. If you need help quitting, ask your health care provider. Visit your dentist if you have not gone yet during your pregnancy. Use a soft toothbrush to brush your teeth and be gentle when you floss. Contact a health care provider if: You have dizziness. You have mild pelvic cramps, pelvic pressure, or nagging pain in the abdominal area. You have persistent nausea, vomiting, or diarrhea. You have a bad smelling vaginal discharge. You have pain when you urinate. Get help right away if: You have a fever. You are leaking fluid from your vagina. You have spotting or bleeding from your vagina. You have severe abdominal cramping or pain. You have rapid weight gain or weight loss. You have shortness of breath with chest pain. You notice sudden or extreme swelling of your face, hands, ankles, feet, or legs. You  have not felt your baby move in over an hour. You have severe headaches that do not go away when you take medicine. You have vision changes. Summary The second trimester is from week 14 through week 27 (months 4 through 6). It is also a time when the fetus is growing rapidly. Your body goes through many changes during pregnancy. The changes vary from woman to woman. Avoid all smoking, herbs, alcohol, and unprescribed drugs. These chemicals affect the formation and growth your baby. Do not use any tobacco products, such as cigarettes, chewing tobacco, and e-cigarettes. If you need help quitting, ask your health care provider. Contact your health care provider if you have any questions. Keep all prenatal visits as told by your health care provider. This is important. This information is not intended to replace advice given to you by your health care provider. Make sure you discuss any questions you have with your health care provider. Document Released: 10/11/2001 Document Revised: 03/24/2016 Document Reviewed: 12/18/2012 Elsevier Interactive Patient Education  2017 [employer]  

## 2021-11-09 LAB — INTEGRATED 2
AFP MoM: 1
Alpha-Fetoprotein: 43.6 ng/mL
Crown Rump Length: 58 mm
DIA MoM: 0.52
DIA Value: 113.2 pg/mL
Estriol, Unconjugated: 1.49 ng/mL
Gest. Age on Collection Date: 12.1 weeks
Gestational Age: 16.3 weeks
Maternal Age at EDD: 25 yr
Nuchal Translucency (NT): 1.7 mm
Nuchal Translucency MoM: 1.31
Number of Fetuses: 1
PAPP-A MoM: 1.57
PAPP-A Value: 2246.4 ng/mL
Test Results:: NEGATIVE
Weight: 94 [lb_av]
Weight: 94 [lb_av]
hCG MoM: 0.69
hCG Value: 31.2 IU/mL
uE3 MoM: 1.34

## 2021-12-01 ENCOUNTER — Ambulatory Visit (INDEPENDENT_AMBULATORY_CARE_PROVIDER_SITE_OTHER): Payer: Medicaid Other

## 2021-12-01 ENCOUNTER — Other Ambulatory Visit: Payer: Self-pay

## 2021-12-01 ENCOUNTER — Ambulatory Visit (INDEPENDENT_AMBULATORY_CARE_PROVIDER_SITE_OTHER): Payer: Medicaid Other | Admitting: Advanced Practice Midwife

## 2021-12-01 VITALS — BP 104/69 | HR 74 | Wt 109.0 lb

## 2021-12-01 DIAGNOSIS — Z363 Encounter for antenatal screening for malformations: Secondary | ICD-10-CM | POA: Diagnosis not present

## 2021-12-01 DIAGNOSIS — Z3480 Encounter for supervision of other normal pregnancy, unspecified trimester: Secondary | ICD-10-CM

## 2021-12-01 DIAGNOSIS — Z3A19 19 weeks gestation of pregnancy: Secondary | ICD-10-CM

## 2021-12-01 NOTE — Patient Instructions (Signed)
[place], thank you for choosing our office today! We appreciate the opportunity to meet your healthcare needs. You may receive a short survey by mail, e-mail, or through [employer]. If you are happy with your care we would appreciate if you could take just a few minutes to complete the survey questions. We read all of your comments and take your feedback very seriously. Thank you again for choosing our office.  [facility] [employer] Team at [place] at [facility] ([place], [place] [address]) Entrance C, located off of [address] parking  Go to [email or web address] to register for FREE online childbirth classes  Call the office [phone]) or go to [facility] if: You begin to severe cramping Your water breaks.  Sometimes it is a big gush of fluid, sometimes it is just a trickle that keeps getting your panties wet or running down your legs You have vaginal bleeding.  It is normal to have a small amount of spotting if your cervix was checked.   [facility] Pediatricians/Family Doctors [facility]): [address] [place], [place]: [address] [place], [phone]                [facility]): [place], [phone] (call to ask if accepting patients) [facility] Department: [place] [address], [place], [place] Pediatricians/Family Doctors [facility]): [place]. [place], [address], [facility]: [address] [place], [facility] of [place]: [place], [facility]): [phone] [facility]: [address], [place]: [address] [facility]: [phone], [phone]  Home Blood Pressure Monitoring for Patients   Your provider has recommended that you check your blood pressure (BP) at least once a week at home. If you do not have a blood pressure cuff at home, one will be provided for you. Contact your provider if you have not received your monitor within 1 week.   Helpful Tips for Accurate Home Blood Pressure Checks  Don't smoke, exercise, or drink caffeine 30 minutes before checking your BP Use the restroom before checking your BP (a full bladder can raise your pressure) Relax in a comfortable upright chair Feet on the ground Left arm resting comfortably on a flat surface at the level of your heart Legs uncrossed Back supported Sit quietly and don't talk Place the cuff on your bare arm Adjust snuggly, so that only two fingertips can fit between your skin and the top of the cuff Check 2 readings separated by at least one minute Keep a log of your BP readings For a visual, please reference this diagram: http://ccnc.care/bpdiagram  Provider Name: [facility]     Phone: [phone]  Zone 1: ALL CLEAR  Continue to monitor your symptoms:  BP reading is less than 140 (top number) or less than 90 (bottom number)  No right upper stomach pain No headaches or seeing spots No feeling nauseated or throwing up No swelling in face and hands  Zone 2: CAUTION Call your doctor's office for any of the following:  BP reading is greater than 140 (top number) or greater than  90 (bottom number)  Stomach pain under your ribs in the middle or right side Headaches or seeing spots Feeling nauseated or throwing up Swelling in face and hands  Zone 3: EMERGENCY  Seek immediate medical care if you have any of the following:  BP reading is greater than160 (top number) or greater than 110 (bottom number) Severe headaches not improving with Tylenol Serious difficulty catching your breath Any worsening symptoms from  Zone 2     Second Trimester of Pregnancy The second trimester is from week 14 through week 27 (months 4 through 6). The second trimester is often a time when you feel your best. Your body has adjusted to being pregnant, and you begin to feel better physically. Usually, morning sickness has lessened or quit completely, you may have more energy, and you may have an increase in appetite. The second trimester is also a time when the fetus is growing rapidly. At the end of the sixth month, the fetus is about 9 inches long and weighs about 1 pounds. You will likely begin to feel the baby move (quickening) between 16 and 20 weeks of pregnancy. Body changes during your second trimester Your body continues to go through many changes during your second trimester. The changes vary from woman to woman. Your weight will continue to increase. You will notice your lower abdomen bulging out. You may begin to get stretch marks on your hips, abdomen, and breasts. You may develop headaches that can be relieved by medicines. The medicines should be approved by your health care provider. You may urinate more often because the fetus is pressing on your bladder. You may develop or continue to have heartburn as a result of your pregnancy. You may develop constipation because certain hormones are causing the muscles that push waste through your intestines to slow down. You may develop hemorrhoids or swollen, bulging veins (varicose veins). You may have back pain. This is caused by: Weight gain. Pregnancy hormones that are relaxing the joints in your pelvis. A shift in weight and the muscles that support your balance. Your breasts will continue to grow and they will continue to become tender. Your gums may bleed and may be sensitive to brushing and flossing. Dark spots or blotches (chloasma, mask of pregnancy) may develop on your face. This will likely fade after the baby is born. A dark line from your belly button to  the pubic area (linea nigra) may appear. This will likely fade after the baby is born. You may have changes in your hair. These can include thickening of your hair, rapid growth, and changes in texture. Some women also have hair loss during or after pregnancy, or hair that feels dry or thin. Your hair will most likely return to normal after your baby is born.  What to expect at prenatal visits During a routine prenatal visit: You will be weighed to make sure you and the fetus are growing normally. Your blood pressure will be taken. Your abdomen will be measured to track your baby's growth. The fetal heartbeat will be listened to. Any test results from the previous visit will be discussed.  Your health care provider may ask you: How you are feeling. If you are feeling the baby move. If you have had any abnormal symptoms, such as leaking fluid, bleeding, severe headaches, or abdominal cramping. If you are using any tobacco products, including cigarettes, chewing tobacco, and electronic cigarettes. If you have any questions.  Other tests that may be performed during   your second trimester include: Blood tests that check for: Low iron levels (anemia). High blood sugar that affects pregnant women (gestational diabetes) between 24 and 28 weeks. Rh antibodies. This is to check for a protein on red blood cells (Rh factor). Urine tests to check for infections, diabetes, or protein in the urine. An ultrasound to confirm the proper growth and development of the baby. An amniocentesis to check for possible genetic problems. Fetal screens for spina bifida and Down syndrome. HIV (human immunodeficiency virus) testing. Routine prenatal testing includes screening for HIV, unless you choose not to have this test.  Follow these instructions at home: Medicines Follow your health care provider's instructions regarding medicine use. Specific medicines may be either safe or unsafe to take during  pregnancy. Take a prenatal vitamin that contains at least 600 micrograms (mcg) of folic acid. If you develop constipation, try taking a stool softener if your health care provider approves. Eating and drinking Eat a balanced diet that includes fresh fruits and vegetables, whole grains, good sources of protein such as meat, eggs, or tofu, and low-fat dairy. Your health care provider will help you determine the amount of weight gain that is right for you. Avoid raw meat and uncooked cheese. These carry germs that can cause birth defects in the baby. If you have low calcium intake from food, talk to your health care provider about whether you should take a daily calcium supplement. Limit foods that are high in fat and processed sugars, such as fried and sweet foods. To prevent constipation: Drink enough fluid to keep your urine clear or pale yellow. Eat foods that are high in fiber, such as fresh fruits and vegetables, whole grains, and beans. Activity Exercise only as directed by your health care provider. Most women can continue their usual exercise routine during pregnancy. Try to exercise for 30 minutes at least 5 days a week. Stop exercising if you experience uterine contractions. Avoid heavy lifting, wear low heel shoes, and practice good posture. A sexual relationship may be continued unless your health care provider directs you otherwise. Relieving pain and discomfort Wear a good support bra to prevent discomfort from breast tenderness. Take warm sitz baths to soothe any pain or discomfort caused by hemorrhoids. Use hemorrhoid cream if your health care provider approves. Rest with your legs elevated if you have leg cramps or low back pain. If you develop varicose veins, wear support hose. Elevate your feet for 15 minutes, 3-4 times a day. Limit salt in your diet. Prenatal Care Write down your questions. Take them to your prenatal visits. Keep all your prenatal visits as told by your health  care provider. This is important. Safety Wear your seat belt at all times when driving. Make a list of emergency phone numbers, including numbers for family, friends, the hospital, and police and fire departments. General instructions Ask your health care provider for a referral to a local prenatal education class. Begin classes no later than the beginning of month 6 of your pregnancy. Ask for help if you have counseling or nutritional needs during pregnancy. Your health care provider can offer advice or refer you to specialists for help with various needs. Do not use hot tubs, steam rooms, or saunas. Do not douche or use tampons or scented sanitary pads. Do not cross your legs for long periods of time. Avoid cat litter boxes and soil used by cats. These carry germs that can cause birth defects in the baby and possibly loss of the   fetus by miscarriage or stillbirth. Avoid all smoking, herbs, alcohol, and unprescribed drugs. Chemicals in these products can affect the formation and growth of the baby. Do not use any products that contain nicotine or tobacco, such as cigarettes and e-cigarettes. If you need help quitting, ask your health care provider. Visit your dentist if you have not gone yet during your pregnancy. Use a soft toothbrush to brush your teeth and be gentle when you floss. Contact a health care provider if: You have dizziness. You have mild pelvic cramps, pelvic pressure, or nagging pain in the abdominal area. You have persistent nausea, vomiting, or diarrhea. You have a bad smelling vaginal discharge. You have pain when you urinate. Get help right away if: You have a fever. You are leaking fluid from your vagina. You have spotting or bleeding from your vagina. You have severe abdominal cramping or pain. You have rapid weight gain or weight loss. You have shortness of breath with chest pain. You notice sudden or extreme swelling of your face, hands, ankles, feet, or legs. You  have not felt your baby move in over an hour. You have severe headaches that do not go away when you take medicine. You have vision changes. Summary The second trimester is from week 14 through week 27 (months 4 through 6). It is also a time when the fetus is growing rapidly. Your body goes through many changes during pregnancy. The changes vary from woman to woman. Avoid all smoking, herbs, alcohol, and unprescribed drugs. These chemicals affect the formation and growth your baby. Do not use any tobacco products, such as cigarettes, chewing tobacco, and e-cigarettes. If you need help quitting, ask your health care provider. Contact your health care provider if you have any questions. Keep all prenatal visits as told by your health care provider. This is important. This information is not intended to replace advice given to you by your health care provider. Make sure you discuss any questions you have with your health care provider. Document Released: 10/11/2001 Document Revised: 03/24/2016 Document Reviewed: 12/18/2012 Elsevier Interactive Patient Education  2017 [employer] Inc[employer]  

## 2021-12-01 NOTE — Progress Notes (Signed)
° °  LOW-RISK PREGNANCY VISIT Patient name: Elizabeth Bird MRN [id number]  Date of birth: [birth year] Chief Complaint:   Routine Prenatal Visit  History of Present Illness:   Elizabeth Bird is a 26 y.o. G35P1011 female at [redacted]w[redacted]d with an Estimated Date of Delivery: 04/22/22 being seen today for ongoing management of a low-risk pregnancy.  Today she reports  feeling 'pressure' on her cervix and also have occ BH ctx . Contractions: Not present. Vag. Bleeding: None.  Movement: Present. denies leaking of fluid. Review of Systems:   Pertinent items are noted in HPI Denies abnormal vaginal discharge w/ itching/odor/irritation, headaches, visual changes, shortness of breath, chest pain, abdominal pain, severe nausea/vomiting, or problems with urination or bowel movements unless otherwise stated above. Pertinent History Reviewed:  Reviewed past medical,surgical, social, obstetrical and family history.  Reviewed problem list, medications and allergies. Physical Assessment:   Vitals:   12/01/21 1048  BP: 104/69  Pulse: 74  Weight: 109 lb (49.4 kg)  Body mass index is 18.71 kg/m.        Physical Examination:   General appearance: Well appearing, and in no distress  Mental status: Alert, oriented to person, place, and time  Skin: Warm & dry  Cardiovascular: Normal heart rate noted  Respiratory: Normal respiratory effort, no distress  Abdomen: Soft, gravid, nontender  Pelvic: Cervical exam deferred         Extremities: Edema: None  Fetal Status: Fetal Heart Rate (bpm): 157 u/s   Movement: Present    Anatomy u/s: [place] 84+5 wks,cephalic,posterior placenta gr 0,normal ovaries,cx 2.8 cm,SVP of fluid 4.1 cm,FHR 157 bpm,EFW 338 g 72%,anatomy complete,no obvious abnormalities  No results found for this or any previous visit (from the past 24 hour(s)).  Assessment & Plan:  1) Low-risk pregnancy G4P1011 at [redacted]w[redacted]d with an Estimated Date of Delivery: 04/22/22   2) Pressure/LBP, rec maternity support belt;  reassured cx length is nl today   Meds: No orders of the defined types were placed in this encounter.  Labs/procedures today: anatomy u/s  Plan:  Continue routine obstetrical care   Reviewed: Preterm labor symptoms and general obstetric precautions including but not limited to vaginal bleeding, contractions, leaking of fluid and fetal movement were reviewed in detail with the patient.  All questions were answered. Has home bp cuff.  Check bp weekly, let us know if >140/90.   Follow-up: Return in about 4 weeks (around 12/29/2021) for [place], in person.  No orders of the defined types were placed in this encounter.  Myrtis Ser CNM 12/01/2021 11:24 AM

## 2021-12-01 NOTE — Progress Notes (Signed)
[place] 38+1 wks,cephalic,posterior placenta gr 0,normal ovaries,cx 2.8 cm,SVP of fluid 4.1 cm,FHR 157 bpm,EFW 338 g 72%,anatomy complete,no obvious abnormalities

## 2021-12-26 ENCOUNTER — Inpatient Hospital Stay (HOSPITAL_COMMUNITY)
Admission: AD | Admit: 2021-12-26 | Discharge: 2021-12-26 | Disposition: A | Discharge status: Home / Self Care | Payer: Medicaid Other | Source: Ambulatory Visit | Attending: Obstetrics & Gynecology | Admitting: Obstetrics & Gynecology

## 2021-12-26 ENCOUNTER — Encounter (HOSPITAL_COMMUNITY): Payer: Self-pay | Admitting: Obstetrics & Gynecology

## 2021-12-26 ENCOUNTER — Other Ambulatory Visit: Payer: Self-pay

## 2021-12-26 ENCOUNTER — Inpatient Hospital Stay (HOSPITAL_BASED_OUTPATIENT_CLINIC_OR_DEPARTMENT_OTHER): Payer: Medicaid Other

## 2021-12-26 DIAGNOSIS — R102 Pelvic and perineal pain: Secondary | ICD-10-CM | POA: Insufficient documentation

## 2021-12-26 DIAGNOSIS — O26892 Other specified pregnancy related conditions, second trimester: Secondary | ICD-10-CM | POA: Diagnosis not present

## 2021-12-26 DIAGNOSIS — Z0371 Encounter for suspected problem with amniotic cavity and membrane ruled out: Secondary | ICD-10-CM

## 2021-12-26 DIAGNOSIS — Z3689 Encounter for other specified antenatal screening: Secondary | ICD-10-CM | POA: Insufficient documentation

## 2021-12-26 DIAGNOSIS — O4702 False labor before 37 completed weeks of gestation, second trimester: Secondary | ICD-10-CM | POA: Diagnosis not present

## 2021-12-26 DIAGNOSIS — Z3A23 23 weeks gestation of pregnancy: Secondary | ICD-10-CM | POA: Diagnosis not present

## 2021-12-26 DIAGNOSIS — N898 Other specified noninflammatory disorders of vagina: Secondary | ICD-10-CM | POA: Insufficient documentation

## 2021-12-26 DIAGNOSIS — O09212 Supervision of pregnancy with history of pre-term labor, second trimester: Secondary | ICD-10-CM | POA: Insufficient documentation

## 2021-12-26 DIAGNOSIS — O09892 Supervision of other high risk pregnancies, second trimester: Secondary | ICD-10-CM

## 2021-12-26 DIAGNOSIS — O4700 False labor before 37 completed weeks of gestation, unspecified trimester: Secondary | ICD-10-CM

## 2021-12-26 LAB — WET PREP, GENITAL
Sperm: NONE SEEN
Trich, Wet Prep: NONE SEEN
WBC, Wet Prep HPF POC: <10 (ref <10)
Yeast Wet Prep HPF POC: NONE SEEN

## 2021-12-26 LAB — URINALYSIS, ROUTINE W REFLEX MICROSCOPIC
Bilirubin Urine: NEGATIVE
Glucose, UA: NEGATIVE mg/dL
Hgb urine dipstick: NEGATIVE
Ketones, ur: NEGATIVE mg/dL
Nitrite: NEGATIVE
Protein, ur: NEGATIVE mg/dL
Specific Gravity, Urine: 1.016 (ref 1.005–1.030)
pH: 6 (ref 5.0–8.0)

## 2021-12-26 MED ORDER — TERBUTALINE SULFATE 1 MG/ML IJ SOLN
0.2500 mg | Freq: Once | INTRAMUSCULAR | Status: AC
Start: 2021-12-26 — End: 2021-12-26
  Administered 2021-12-26: 0.25 mg via SUBCUTANEOUS

## 2021-12-26 MED ORDER — NIFEDIPINE 10 MG PO CAPS: 10.0000 mg | ORAL_CAPSULE | Freq: Three times a day (TID) | ORAL | 0 refills | Status: DC

## 2021-12-26 MED ORDER — TERBUTALINE SULFATE 1 MG/ML IJ SOLN
INTRAMUSCULAR | Status: AC
  Filled 2021-12-26: qty 1

## 2021-12-26 MED ORDER — PROGESTERONE 200 MG PO CAPS
ORAL_CAPSULE | ORAL | 3 refills | Status: DC
Start: 2021-12-26 — End: 2022-04-08

## 2021-12-26 NOTE — MAU Note (Signed)
Pt reports mucus-discharge that occurred a few hours ago. She showed this RN a picture on her phone. Clear, thick mucus string visible on a sheet of toilet paper. Pt visibly jumped when RN palpated abdomen, even after RN asked permission to touch first. Pt denies abdominal tenderness however. Pt reports feeling safe in the home. Pt reports "Braxton hicks" contractions, multiple ctx per hour.

## 2021-12-26 NOTE — MAU Provider Note (Signed)
History     CSN: [id number]  Arrival date and time: 12/26/21 0092   Event Date/Time   First Provider Initiated Contact with Patient 12/26/21 2110      Chief Complaint  Patient presents with   Contractions   [name] is a 26 y.o. year old G68P1011 female at [redacted]w[redacted]d weeks gestation who presents to MAU reporting losing mucous plug last night and frequent Braxton-Hick contractions. She denies any vaginal bleeding. She reports good (+) FM. She has a high risk history of PPROM with PTD @ [redacted] weeks gestation. Her PNC is done at [facility]. Her FOB is present and contributing to the history taking.    OB History     Gravida  4   Para  2   Term  1   Preterm      AB  1   Living  1      SAB  1   IAB      Ectopic      Multiple  0   Live Births  1           Past Medical History:  Diagnosis Date   Medical history non-contributory     Past Surgical History:  Procedure Laterality Date   NO PAST SURGERIES      Family History  Problem Relation Age of Onset   Multiple sclerosis Maternal Grandmother    Arthritis Maternal Grandmother    Suicidality Maternal Grandfather    Multiple sclerosis Mother    Thyroid disease Sister    Schizophrenia Sister    Bipolar disorder Sister    Post-traumatic stress disorder Sister    Asthma Maternal Aunt    Cancer Maternal Aunt        colon    Social History   Tobacco Use   Smoking status: Former    Years: 7.00    Types: Cigarettes   Smokeless tobacco: Never  Vaping Use   Vaping Use: Never used  Substance Use Topics   Alcohol use: Not Currently    Comment: occasionally; not now   Drug use: No    Types: Marijuana    Comment: LAST use was Sept 2016    Allergies:  Allergies  Allergen Reactions   Peroxide [Hydrogen Peroxide] Hives    Medications Prior to Admission  Medication Sig Dispense Refill Last Dose   Blood Pressure Monitor MISC For regular home bp monitoring during pregnancy (Patient not taking:  Reported on 12/01/2021) 1 each 0    ondansetron (ZOFRAN-ODT) 4 MG disintegrating tablet Take by mouth. (Patient not taking: Reported on 10/07/2021)      Prenatal Vit-Fe Fumarate-FA (PRENATAL MULTIVITAMIN) TABS tablet Take 1 tablet by mouth daily at 12 noon.   More than a month    Review of Systems  Constitutional: Negative.   HENT: Negative.    Eyes: Negative.   Respiratory: Negative.    Cardiovascular: Negative.   Gastrointestinal: Negative.   Endocrine: Negative.   Genitourinary:  Positive for pelvic pain (contractions) and vaginal discharge (clear, mucous).  Musculoskeletal: Negative.   Skin: Negative.   Allergic/Immunologic: Negative.   Neurological: Negative.   Hematological: Negative.   Psychiatric/Behavioral: Negative.    Physical Exam   Blood pressure 107/63, pulse 70, temperature 98.1 F (36.7 C), temperature source Oral, resp. rate 16, weight 53.9 kg, SpO2 100 %, currently breastfeeding.  Physical Exam Vitals and nursing note reviewed. Exam conducted with a chaperone present.  Constitutional:      Appearance: Normal appearance. She  is normal weight.  Genitourinary:    General: Normal vulva.     Comments: Pelvic exam: External genitalia normal, SE: vaginal walls pink and well rugated, cervix is smooth, pink, no lesions, small amt of clear, mucoid vaginal d/c -- WP, GC/CT done, cervix: 1 cm/thick/soft/high, Uterus is no-tender, S<D, no CMT or friability, no adnexal tenderness. Musculoskeletal:        General: Normal range of motion.  Skin:    General: Skin is warm and dry.  Neurological:     Mental Status: She is alert and oriented to person, place, and time.  Psychiatric:        Mood and Affect: Mood normal.        Behavior: Behavior normal.        Thought Content: Thought content normal.        Judgment: Judgment normal.   REACTIVE NST - FHR: 140 bpm / moderate variability / accels present / decels absent / TOCO: regular every 2-4 mins  MAU Course   Procedures  MDM CCUA Wet Prep GC/CT --Results pending  OB MFM Limited U/S Terbutaline 0.25 mg SQ -- stopped UCs  *Consult with Dr. Marybelle Killings @ 2130 - notified of patient's complaints, assessments, lab & NST results, tx plan Terbutaline or Procardia to stop contractions, U/S to check AFI - agrees with plan   updated at 2221 - ok to d/c home with Rx for Prometrium 200 mg vaginally at bedtime and Procardia 10 mg every 8 hours prn UCs.   Results for orders placed or performed during the hospital encounter of 12/26/21 (from the past 24 hour(s))  Urinalysis, Routine w reflex microscopic Urine, Clean Catch     Status: Abnormal   Collection Time: 12/26/21  9:22 PM  Result Value Ref Range   Color, Urine YELLOW YELLOW   APPearance HAZY (A) CLEAR   Specific Gravity, Urine 1.016 1.005 - 1.030   pH 6.0 5.0 - 8.0   Glucose, UA NEGATIVE NEGATIVE mg/dL   Hgb urine dipstick NEGATIVE NEGATIVE   Bilirubin Urine NEGATIVE NEGATIVE   Ketones, ur NEGATIVE NEGATIVE mg/dL   Protein, ur NEGATIVE NEGATIVE mg/dL   Nitrite NEGATIVE NEGATIVE   Leukocytes,Ua MODERATE (A) NEGATIVE   RBC / HPF 0-5 0 - 5 RBC/hpf   WBC, UA 6-10 0 - 5 WBC/hpf   Bacteria, UA MANY (A) NONE SEEN   Squamous Epithelial / LPF 0-5 0 - 5   Mucus PRESENT   Wet prep, genital     Status: Abnormal   Collection Time: 12/26/21  9:22 PM   Specimen: Urine, Clean Catch  Result Value Ref Range   Yeast Wet Prep HPF POC NONE SEEN NONE SEEN   Trich, Wet Prep NONE SEEN NONE SEEN   Clue Cells Wet Prep HPF POC PRESENT (A) NONE SEEN   WBC, Wet Prep HPF POC <10 <10   Sperm NONE SEEN      [place] MFM OB LIMITED  Result Date: 12/26/2021 ----------------------------------------------------------------------  OBSTETRICS REPORT                        (Signed Final 12/26/2021 10:24 pm) ---------------------------------------------------------------------- Patient Info  ID #:       [id number]                          D.O.B.:  June 04, 1996 (25 yrs)  Name:        Elizabeth Bird  Visit Date: 12/26/2021 10:09 pm ---------------------------------------------------------------------- Performed By  Attending:        Sander Nephew      Secondary Phy.:    [facility] MAU/Triage                    MD  Performed By:     Dorena Dew     Location:          [facility]                    BS, RDMS                                  [facility] at                                                              [place] for                                                              Women  Referred By:      Laury Deep ---------------------------------------------------------------------- Orders  #  Description                           Code        Ordered By  1  [place] MFM OB LIMITED                     51025.85    IDPOEUM Shatora Weatherbee ----------------------------------------------------------------------  #  Order #                     Accession #                Episode #  1  [id number]                   [id number]                 [id number] ---------------------------------------------------------------------- Indications  Preterm contractions                            O47.00  [redacted] weeks gestation of pregnancy                 Z3A.23  Vaginal discharge during pregnancy in           O26.892  second trimester ---------------------------------------------------------------------- Fetal Evaluation  Num Of Fetuses:          1  Fetal Heart Rate(bpm):   159  Cardiac Activity:        Observed  Presentation:            Cephalic  Placenta:                Posterior  P. Cord Insertion:       Visualized  Amniotic Fluid  AFI FV:      Within normal limits  Largest Pocket(cm)                              6.2  Comment:    No placental abruption or previa identified. ---------------------------------------------------------------------- OB History  Gravidity:    4         Term:   1        Prem:   1        SAB:   1  TOP:          0       Ectopic:  0        Living: 1  ---------------------------------------------------------------------- Gestational Age  Best:          23w 2d     Det. ByLoman Chroman         EDD:   04/22/22                                      (09/10/21) ---------------------------------------------------------------------- Cervix Uterus Adnexa  Cervix  Length:           2.74  cm.  Normal appearance by transabdominal scan.  Right Ovary  Within normal limits.  Left Ovary  Within normal limits.  Adnexa  No abnormality visualized. ---------------------------------------------------------------------- Impression  Limited exam for preterm contraction and vaginal discharge  Good fetal movement and amniotic fluid volume  CL is 2.74 cm  No evidence of placental abruption or previa. ---------------------------------------------------------------------- Recommendations  Clinical correlation recommended. ----------------------------------------------------------------------               Sander Nephew, MD Electronically Signed Final Report   12/26/2021 10:24 pm ----------------------------------------------------------------------   Assessment and Plan  Preterm uterine contractions in second trimester, antepartum  - Reassurance given that Terbutaline shot stopped contractions tonight - Rx for Procardia 10 mg every 8 hours prn - Advised to return to MAU for more than 6 painful contractions in 1 hour that are not stopping with use of Procardia  No leakage of amniotic fluid into vagina - Reassurance given that membranes are intact and not ruptured  History of preterm delivery, currently pregnant in second trimester - Due to cervical length of 2.74 cm will prescribe Prometrium - Rx for Prometrium 200 mg tablet vaginally at bedtime - Information provided on PTL, preventing PTB and pelvic rest   [redacted] weeks gestation of pregnancy   - Discharge patient - Keep scheduled appt with [facility] on Wednesday 12/29/2021 - Patient verbalized an understanding of  the plan of care and agrees.    Laury Deep, CNM 12/26/2021, 9:10 PM

## 2021-12-26 NOTE — MAU Note (Signed)
Pt reports to MAU with c/o that she lost her mucous plug last night (pt has picture).  Pt states that she was advised by on call nurse to come in due to her also have frequent contractions with her history.  Pt denies VB and LOF.  +FM

## 2021-12-26 NOTE — Discharge Instructions (Signed)
Return to MAU for more than 6 PAINFUL contractions that are not stopped by using the Procardia medication prescribed to you for preterm contractions. Drink at least 8-10 bottles of water everyday.

## 2021-12-27 LAB — GC/CHLAMYDIA PROBE AMP (~~LOC~~) NOT AT ARMC
Chlamydia: NEGATIVE
Comment: NEGATIVE
Comment: NORMAL
Neisseria Gonorrhea: NEGATIVE

## 2021-12-29 ENCOUNTER — Other Ambulatory Visit: Payer: Self-pay

## 2021-12-29 ENCOUNTER — Ambulatory Visit (INDEPENDENT_AMBULATORY_CARE_PROVIDER_SITE_OTHER): Payer: Medicaid Other | Admitting: Advanced Practice Midwife

## 2021-12-29 VITALS — BP 103/70 | HR 116 | Wt 118.5 lb

## 2021-12-29 DIAGNOSIS — Z3480 Encounter for supervision of other normal pregnancy, unspecified trimester: Secondary | ICD-10-CM

## 2021-12-29 DIAGNOSIS — Z3A23 23 weeks gestation of pregnancy: Secondary | ICD-10-CM

## 2021-12-29 MED ORDER — PRENATAL 6.75-0.2 MG PO TABS: 1.0000 | ORAL_TABLET | Freq: Every day | ORAL | 6 refills | Status: DC

## 2021-12-29 MED ORDER — METRONIDAZOLE 500 MG PO TABS: 500.0000 mg | ORAL_TABLET | Freq: Two times a day (BID) | ORAL | 0 refills | Status: DC

## 2021-12-29 NOTE — Patient Instructions (Signed)
Paschal Dopp, I greatly value your feedback.  If you receive a survey following your visit with [place] today, we appreciate you taking the time to fill it out.  Thanks, Derrill Memo, CNM   You will have your sugar test next visit.  Please do not eat or drink anything after midnight the night before you come, not even water.  You will be here for at least two hours.  Please make an appointment online for the bloodwork at [email or web address] for 8:30am (or as close to this as possible). Make sure you select the [facility] service center. The day of the appointment, check in with our office first, then you will go to [place] to start the sugar test.    [facility] [place] It is now [place] [facility] ([place], [place] [address]) Entrance C, located off of [address] parking  Go to [email or web address] to register for FREE online childbirth classes   Call the office [phone]) or go to [facility] if: You begin to have strong, frequent contractions Your water breaks.  Sometimes it is a big gush of fluid, sometimes it is just a trickle that keeps getting your panties wet or running down your legs You have vaginal bleeding.  It is normal to have a small amount of spotting if your cervix was checked.  You don't feel your baby moving like normal.  If you don't, get you something to eat and drink and lay down and focus on feeling your baby move.   If your baby is still not moving like normal, you should call the office or go to [place] [facility]: [place] [phone]                [place] [phone] (usually not accepting new patients unless you have family there already, you are always welcome to call and ask)      [facility] Department [phone]       [facility] Pediatricians/Family Doctors:  [facility]: [phone] [facility]: [phone] [facility]: [place] Doctors:  [facility]: [place] [facility]: [place]: [place]: [phone]   Home Blood Pressure Monitoring for Patients   Your provider has recommended that you check your blood pressure (BP) at least once a week at home. If you do not have a blood pressure cuff at home, one will be provided for you. Contact your provider if you have not received your monitor within 1 week.   Helpful Tips for Accurate Home Blood Pressure Checks  Don't smoke, exercise, or drink caffeine 30 minutes before checking your BP Use the restroom before checking your BP (a full bladder can raise your pressure) Relax in a comfortable upright chair Feet on the ground Left arm resting comfortably on a flat surface at the level of your heart Legs uncrossed Back supported Sit quietly and don't talk Place the cuff on your bare arm Adjust snuggly, so that only two fingertips can fit between your skin and the top of the cuff Check 2 readings separated by at least one minute Keep a log of your BP readings For a visual, please reference this diagram: http://ccnc.care/bpdiagram  Provider Name: [facility]     Phone: [phone]  Zone 1: ALL CLEAR  Continue to monitor your symptoms:  BP reading is less than 140 (top number) or less than 90 (bottom number)  No right upper stomach pain No headaches or seeing spots No feeling nauseated or throwing up No swelling in face and hands  Zone 2: CAUTION Call your doctor's office for any of the following:  BP reading is greater than 140 (top number) or greater than 90 (bottom number)  Stomach pain under your ribs in the middle or right side Headaches or seeing spots Feeling nauseated or throwing up Swelling in face and hands  Zone 3: EMERGENCY  Seek  immediate medical care if you have any of the following:  BP reading is greater than160 (top number) or greater than 110 (bottom number) Severe headaches not improving with Tylenol Serious difficulty catching your breath Any worsening symptoms from Zone 2   Second Trimester of Pregnancy The second trimester is from week 13 through week 28, months 4 through 6. The second trimester is often a time when you feel your best. Your body has also adjusted to being pregnant, and you begin to feel better physically. Usually, morning sickness has lessened or quit completely, you may have more energy, and you may have an increase in appetite. The second trimester is also a time when the fetus is growing rapidly. At the end of the sixth month, the fetus is about 9 inches long and weighs about 1 pounds. You will likely begin to feel the baby move (quickening) between 18 and 20 weeks of the pregnancy. BODY CHANGES Your body goes through many changes during pregnancy. The changes vary from woman to woman.  Your weight will continue to increase. You will notice your lower abdomen bulging out. You may begin to get stretch marks on your hips, abdomen, and breasts. You may develop headaches that can be relieved by medicines approved by your health care provider. You may urinate more often because the fetus is pressing on your bladder. You may develop or continue to have heartburn as a result of your pregnancy. You may develop constipation because certain hormones are causing the muscles that push waste through your intestines to slow down. You may develop hemorrhoids or swollen, bulging veins (varicose veins). You may have back pain because of the weight gain and pregnancy hormones relaxing your joints between the bones in your pelvis and as a result of a shift in weight and the muscles that support your balance. Your breasts will continue to grow and be tender. Your gums may bleed and may be sensitive to brushing  and flossing. Dark spots or blotches (chloasma, mask of pregnancy) may develop on your face. This will likely fade after the baby is born. A dark line from your belly button to the pubic area (linea nigra) may appear. This will likely fade after the baby is born. You may have changes in your hair. These can include thickening of your hair, rapid growth, and changes in texture. Some women also have hair loss during or after pregnancy, or hair that feels dry or thin. Your hair will most likely return to normal after your baby is born. WHAT TO EXPECT AT YOUR PRENATAL VISITS During a routine prenatal visit: You will be weighed to make sure you and the fetus are growing normally. Your blood pressure will be taken. Your abdomen will be measured to track your baby's growth. The fetal heartbeat will be listened to. Any test results from the previous visit will be discussed. Your health care provider  may ask you: How you are feeling. If you are feeling the baby move. If you have had any abnormal symptoms, such as leaking fluid, bleeding, severe headaches, or abdominal cramping. If you have any questions. Other tests that may be performed during your second trimester include: Blood tests that check for: Low iron levels (anemia). Gestational diabetes (between 24 and 28 weeks). Rh antibodies. Urine tests to check for infections, diabetes, or protein in the urine. An ultrasound to confirm the proper growth and development of the baby. An amniocentesis to check for possible genetic problems. Fetal screens for spina bifida and Down syndrome. HOME CARE INSTRUCTIONS  Avoid all smoking, herbs, alcohol, and unprescribed drugs. These chemicals affect the formation and growth of the baby. Follow your health care provider's instructions regarding medicine use. There are medicines that are either safe or unsafe to take during pregnancy. Exercise only as directed by your health care provider. Experiencing  uterine cramps is a good sign to stop exercising. Continue to eat regular, healthy meals. Wear a good support bra for breast tenderness. Do not use hot tubs, steam rooms, or saunas. Wear your seat belt at all times when driving. Avoid raw meat, uncooked cheese, cat litter boxes, and soil used by cats. These carry germs that can cause birth defects in the baby. Take your prenatal vitamins. Try taking a stool softener (if your health care provider approves) if you develop constipation. Eat more high-fiber foods, such as fresh vegetables or fruit and whole grains. Drink plenty of fluids to keep your urine clear or pale yellow. Take warm sitz baths to soothe any pain or discomfort caused by hemorrhoids. Use hemorrhoid cream if your health care provider approves. If you develop varicose veins, wear support hose. Elevate your feet for 15 minutes, 3-4 times a day. Limit salt in your diet. Avoid heavy lifting, wear low heel shoes, and practice good posture. Rest with your legs elevated if you have leg cramps or low back pain. Visit your dentist if you have not gone yet during your pregnancy. Use a soft toothbrush to brush your teeth and be gentle when you floss. A sexual relationship may be continued unless your health care provider directs you otherwise. Continue to go to all your prenatal visits as directed by your health care provider. SEEK MEDICAL CARE IF:  You have dizziness. You have mild pelvic cramps, pelvic pressure, or nagging pain in the abdominal area. You have persistent nausea, vomiting, or diarrhea. You have a bad smelling vaginal discharge. You have pain with urination. SEEK IMMEDIATE MEDICAL CARE IF:  You have a fever. You are leaking fluid from your vagina. You have spotting or bleeding from your vagina. You have severe abdominal cramping or pain. You have rapid weight gain or loss. You have shortness of breath with chest pain. You notice sudden or extreme swelling of your face,  hands, ankles, feet, or legs. You have not felt your baby move in over an hour. You have severe headaches that do not go away with medicine. You have vision changes. Document Released: 10/11/2001 Document Revised: 10/22/2013 Document Reviewed: 12/18/2012 [facility] Patient Information 2015 [place], [place]. This information is not intended to replace advice given to you by your health care provider. Make sure you discuss any questions you have with your health care provider.

## 2021-12-29 NOTE — Progress Notes (Signed)
? ?  LOW-RISK PREGNANCY VISIT ?Patient name: Elizabeth Bird MRN [id number]  Date of birth: Dec 22, 1995 ?Chief Complaint:   ?Routine Prenatal Visit ? ?History of Present Illness:   ?Elizabeth Bird is a 26 y.o. G92P1011 female at [redacted]w[redacted]d with an Estimated Date of Delivery: 04/22/22 being seen today for ongoing management of a low-risk pregnancy.  ?Today she reports  cramping improved since MAU visit on 12/26/21, but still having some each day- taking Procardia and Prometrium . Contractions: Irregular. Vag. Bleeding: None.  Movement: Present. denies leaking of fluid. ?Review of Systems:   ?Pertinent items are noted in HPI ?Denies abnormal vaginal discharge w/ itching/odor/irritation, headaches, visual changes, shortness of breath, chest pain, abdominal pain, severe nausea/vomiting, or problems with urination or bowel movements unless otherwise stated above. ?Pertinent History Reviewed:  ?Reviewed past medical,surgical, social, obstetrical and family history.  ?Reviewed problem list, medications and allergies. ?Physical Assessment:  ? ?Vitals:  ? 12/29/21 0919  ?BP: 103/70  ?Pulse: (!) 116  ?Weight: 118 lb 8 oz (53.8 kg)  ?Body mass index is 20.34 kg/m?. ?  ?     Physical Examination:  ? General appearance: Well appearing, and in no distress ? Mental status: Alert, oriented to person, place, and time ? Skin: Warm & dry ? Cardiovascular: Normal heart rate noted ? Respiratory: Normal respiratory effort, no distress ? Abdomen: Soft, gravid, nontender ? Pelvic: Cervical exam performed  Dilation: Fingertip Effacement (%): Thick Station: -3 ? Extremities: Edema: None ? ?Fetal Status: Fetal Heart Rate (bpm): 152 Fundal Height: 23 cm Movement: Present   ? ?No results found for this or any previous visit (from the past 24 hour(s)).  ?Assessment & Plan:  ?1) Low-risk pregnancy G4P1011 at [redacted]w[redacted]d with an Estimated Date of Delivery: 04/22/22  ? ?2) Preterm ctx, seen in MAU 12/26/21 for vag d/c/cramping; cx 1cm/thick (by u/s 2.74cm, unchanged  from anatomy scan); d/c home with scheduled Procardia and Prometrium; wants to go to [facility] on 3/18- will have to see how she's doing in 2wks ? ?3) BV, from wet prep at hospital; rx Flagyl course ?  ?Meds:  ?Meds ordered this encounter  ?Medications  ? Prenatal 6.75-0.2 MG TABS  ?  Sig: Take 1 tablet by mouth daily.  ?  Dispense:  90 tablet  ?  Refill:  6  ?  Order Specific Question:   Supervising Provider  ?  Answer:   Tania Ade H [[id number]]  ? metroNIDAZOLE (FLAGYL) 500 MG tablet  ?  Sig: Take 1 tablet (500 mg total) by mouth 2 (two) times daily.  ?  Dispense:  14 tablet  ?  Refill:  0  ?  Order Specific Question:   Supervising Provider  ?  Answer:   Tania Ade H [[id number]]  ? ?Labs/procedures today: SVE ? ?Plan:  Continue routine obstetrical care  ? ?Reviewed: Preterm labor symptoms and general obstetric precautions including but not limited to vaginal bleeding, contractions, leaking of fluid and fetal movement were reviewed in detail with the patient.  All questions were answered. Has home bp cuff.  Check bp weekly, let us know if >140/90.  ? ?Follow-up: Return in about 4 weeks (around 01/26/2022) for LROB, PN2, in person. ? ?No orders of the defined types were placed in this encounter. ? ?Myrtis Ser CNM ?12/29/2021 ?9:43 AM  ?

## 2021-12-31 ENCOUNTER — Other Ambulatory Visit: Payer: Self-pay

## 2021-12-31 ENCOUNTER — Inpatient Hospital Stay (HOSPITAL_COMMUNITY)
Admission: AD | Admit: 2021-12-31 | Discharge: 2021-12-31 | Disposition: A | Discharge status: Home / Self Care | Payer: Medicaid Other | Attending: Obstetrics & Gynecology | Admitting: Obstetrics & Gynecology

## 2021-12-31 DIAGNOSIS — Z3A24 24 weeks gestation of pregnancy: Secondary | ICD-10-CM | POA: Diagnosis not present

## 2021-12-31 DIAGNOSIS — O26892 Other specified pregnancy related conditions, second trimester: Secondary | ICD-10-CM | POA: Diagnosis present

## 2021-12-31 DIAGNOSIS — O4702 False labor before 37 completed weeks of gestation, second trimester: Secondary | ICD-10-CM | POA: Insufficient documentation

## 2021-12-31 DIAGNOSIS — O479 False labor, unspecified: Secondary | ICD-10-CM | POA: Diagnosis not present

## 2021-12-31 DIAGNOSIS — O3432 Maternal care for cervical incompetence, second trimester: Secondary | ICD-10-CM | POA: Diagnosis not present

## 2021-12-31 LAB — URINALYSIS, ROUTINE W REFLEX MICROSCOPIC
Bilirubin Urine: NEGATIVE
Glucose, UA: NEGATIVE mg/dL
Hgb urine dipstick: NEGATIVE
Ketones, ur: NEGATIVE mg/dL
Nitrite: NEGATIVE
Protein, ur: 30 mg/dL — AB
Specific Gravity, Urine: 1.034 — ABNORMAL HIGH (ref 1.005–1.030)
pH: 5 (ref 5.0–8.0)

## 2021-12-31 MED ORDER — LACTATED RINGERS IV BOLUS
1000.0000 mL | Freq: Once | INTRAVENOUS | Status: AC
  Administered 2021-12-31: 1000 mL via INTRAVENOUS

## 2021-12-31 MED ORDER — NIFEDIPINE 10 MG PO CAPS
10.0000 mg | ORAL_CAPSULE | ORAL | Status: DC | PRN
  Administered 2021-12-31 (×2): 10 mg via ORAL
  Filled 2021-12-31 (×3): qty 1

## 2021-12-31 MED ORDER — BETAMETHASONE SOD PHOS & ACET 6 (3-3) MG/ML IJ SUSP
12.0000 mg | Freq: Once | INTRAMUSCULAR | Status: AC
  Administered 2021-12-31: 12 mg via INTRAMUSCULAR
  Filled 2021-12-31: qty 5

## 2021-12-31 NOTE — Progress Notes (Signed)
Written and verbal d/c instructions given and understanding voiced. Will return Sat AM for 2nd BMZ injection or sooner for any pregnancy concerns ?

## 2021-12-31 NOTE — MAU Note (Signed)
Pt's B/P 94/60 before 3rd dose of PRocardia due. Elizabeth Guild NP aware and 3rd dose not given ?

## 2021-12-31 NOTE — MAU Note (Signed)
I have been having contractions most of the pregnancy. They have been more intense since yesterday. I was checked by my OB yesterday and was told was alittle less than 1cm and it was 1cm on Sunday. I am taking meds orally and in my vagina for contractions.  ?

## 2021-12-31 NOTE — Discharge Instructions (Signed)
For 6 or more painful contractions per hour - take nifedipine (procardia), drink bottle of water. If symptoms don't improve, return to MAU.  ?

## 2021-12-31 NOTE — MAU Provider Note (Signed)
?History  ?  ? ?[id number] ? ?Arrival date and time: 12/31/21 0113 ?  ? ?Chief Complaint  ?Patient presents with  ? Contractions  ? ? ? ?HPI ?Elizabeth Bird is a 26 y.o. at [redacted]w[redacted]d by 8 week ultrasound with PMHx notable for preterm delivery, who presents for contractions. ?History of 18 week loss followed by term delivery. Was seen in MAU on Sunday for contractions and found to be dilated 1 cm and had a cervical length of 2.7 cm on ultrasound.  She was discharged home on Procardia and started on vaginal progesterone.  Reports increase in painful contractions last night at 11 PM.  Feels them about every 10 minutes.  Took dose of Procardia at 11 PM without change in symptoms.  Denies vomiting, diarrhea, dysuria, vaginal bleeding, or loss of fluid.  Use vaginal progesterone this evening.  Was seen in the office yesterday, cervix was unchanged.  Reports normal fetal movement. ? ?OB History   ? ? Gravida  ?4  ? Para  ?2  ? Term  ?1  ? Preterm  ?   ? AB  ?1  ? Living  ?1  ?  ? ? SAB  ?1  ? IAB  ?   ? Ectopic  ?   ? Multiple  ?0  ? Live Births  ?1  ?   ?  ?  ? ? ?Past Medical History:  ?Diagnosis Date  ? Medical history non-contributory   ? ? ?Past Surgical History:  ?Procedure Laterality Date  ? NO PAST SURGERIES    ? ? ?Family History  ?Problem Relation Age of Onset  ? Multiple sclerosis Maternal Grandmother   ? Arthritis Maternal Grandmother   ? Suicidality Maternal Grandfather   ? Multiple sclerosis Mother   ? Thyroid disease Sister   ? Schizophrenia Sister   ? Bipolar disorder Sister   ? Post-traumatic stress disorder Sister   ? Asthma Maternal Aunt   ? Cancer Maternal Aunt   ?     colon  ? ? ?Allergies  ?Allergen Reactions  ? Peroxide [Hydrogen Peroxide] Hives  ? ? ?No current facility-administered medications on file prior to encounter.  ? ?Current Outpatient Medications on File Prior to Encounter  ?Medication Sig Dispense Refill  ? Blood Pressure Monitor MISC For regular home bp monitoring during pregnancy 1 each 0  ?  metroNIDAZOLE (FLAGYL) 500 MG tablet Take 1 tablet (500 mg total) by mouth 2 (two) times daily. 14 tablet 0  ? NIFEdipine (PROCARDIA) 10 MG capsule Take 1 capsule (10 mg total) by mouth 3 (three) times daily. 90 capsule 0  ? Prenatal 6.75-0.2 MG TABS Take 1 tablet by mouth daily. 90 tablet 6  ? progesterone (PROMETRIUM) 200 MG capsule Place one capsule vaginally at bedtime 30 capsule 3  ? ? ? ?ROS ?Pertinent positives and negative per HPI, all others reviewed and negative ? ?Physical Exam  ? ?BP 102/68 (BP Location: Right Arm)   Pulse 81   Temp 98.4 ?F (36.9 ?C) (Oral)   Resp 17   Ht 5\' 4"  (1.626 m)   Wt 53.1 kg   LMP  (LMP Unknown)   SpO2 99%   BMI 20.08 kg/m?  ? ?Patient Vitals for the past 24 hrs: ? BP Temp Temp src Pulse Resp SpO2 Height Weight  ?12/31/21 0202 102/68 98.4 ?F (36.9 ?C) Oral 81 17 99 % -- --  ?12/31/21 0143 93/60 98.4 ?F (36.9 ?C) -- 61 16 100 % 5\' 4"  (1.626 m) 53.1 kg  ? ? ?  Physical Exam  ? ?Cervical Exam ?Dilation: 1 ?Effacement (%): 20 ?Cervical Position: Middle ?Station: -3 ?Exam by:: Jorje Guild NP ? ? ?FHT ?Baseline 145, moderate variability, no accels, no decels ?Toco: Q 2-3 minutes, resolved with interventions ?Cat: 1 ? ?Labs ?Results for orders placed or performed during the hospital encounter of 12/31/21 (from the past 24 hour(s))  ?Urinalysis, Routine w reflex microscopic Urine, Clean Catch     Status: Abnormal  ? Collection Time: 12/31/21  2:55 AM  ?Result Value Ref Range  ? Color, Urine YELLOW YELLOW  ? APPearance CLOUDY (A) CLEAR  ? Specific Gravity, Urine 1.034 (H) 1.005 - 1.030  ? pH 5.0 5.0 - 8.0  ? Glucose, UA NEGATIVE NEGATIVE mg/dL  ? Hgb urine dipstick NEGATIVE NEGATIVE  ? Bilirubin Urine NEGATIVE NEGATIVE  ? Ketones, ur NEGATIVE NEGATIVE mg/dL  ? Protein, ur 30 (A) NEGATIVE mg/dL  ? Nitrite NEGATIVE NEGATIVE  ? Leukocytes,Ua TRACE (A) NEGATIVE  ? RBC / HPF 6-10 0 - 5 RBC/hpf  ? WBC, UA 0-5 0 - 5 WBC/hpf  ? Bacteria, UA FEW (A) NONE SEEN  ? Squamous Epithelial / LPF  0-5 0 - 5  ? Mucus PRESENT   ? ? ?Imaging ?No results found. ? ?MAU Course  ?Procedures ?Lab Orders    ?     Urinalysis, Routine w reflex microscopic Urine, Clean Catch    ?Meds ordered this encounter  ?Medications  ? lactated ringers bolus 1,000 mL  ? NIFEdipine (PROCARDIA) capsule 10 mg  ? betamethasone acetate-betamethasone sodium phosphate (CELESTONE) injection 12 mg  ? ?Imaging Orders  ?No imaging studies ordered today  ? ? ?MDM ?Patient presents with contractions. Treated with IV fluid bolus & procardia series. Cervix unchanged after 2 hours of monitoring & patient reports improvement in symptoms.  ?Will give betamethasone per consult with Dr. Harolyn Rutherford. Patient agrees with this plan.  ? ?Assessment and Plan  ? ?1. Braxton Hicks contractions   ?2. Premature cervical dilation in second trimester   ?3. [redacted] weeks gestation of pregnancy   ? ?-Return Saturday morning for second betamethasone dose ?-reviewed preterm labor precautions & reasons to return to MAU ?-continue procardia & progesterone at home ? ?Jorje Guild, NP ?12/31/21 ?5:24 AM ? ? ?

## 2022-01-01 ENCOUNTER — Other Ambulatory Visit: Payer: Self-pay

## 2022-01-01 ENCOUNTER — Inpatient Hospital Stay (HOSPITAL_COMMUNITY)
Admit: 2022-01-01 | Discharge: 2022-01-01 | Disposition: A | Discharge status: Home / Self Care | Payer: Medicaid Other | Attending: Obstetrics & Gynecology | Admitting: Obstetrics & Gynecology

## 2022-01-01 ENCOUNTER — Inpatient Hospital Stay (HOSPITAL_COMMUNITY)
Admission: AD | Admit: 2022-01-01 | Discharge: 2022-01-01 | Disposition: A | Discharge status: Home / Self Care | Payer: Medicaid Other | Attending: Obstetrics and Gynecology | Admitting: Obstetrics and Gynecology

## 2022-01-01 DIAGNOSIS — Z3A24 24 weeks gestation of pregnancy: Secondary | ICD-10-CM | POA: Diagnosis not present

## 2022-01-01 DIAGNOSIS — O4702 False labor before 37 completed weeks of gestation, second trimester: Secondary | ICD-10-CM | POA: Diagnosis present

## 2022-01-01 MED ORDER — BETAMETHASONE SOD PHOS & ACET 6 (3-3) MG/ML IJ SUSP
12.0000 mg | Freq: Once | INTRAMUSCULAR | Status: AC
  Administered 2022-01-01: 12 mg via INTRAMUSCULAR

## 2022-01-01 NOTE — MAU Provider Note (Signed)
?Chief Complaint: 2nd Dose of BMZ ? ? None  ?  ? ?SUBJECTIVE ?HPI: Elizabeth Bird is a 26 y.o. [phone] who presents to maternity admissions for 2nd betamethasone injection. She feels much better. She is not having pain or bleeding. She is avoid intercourse and pushing fluids.  ? ?Past Medical History:  ?Diagnosis Date  ? Medical history non-contributory   ? ?Past Surgical History:  ?Procedure Laterality Date  ? NO PAST SURGERIES    ? ?Social History  ? ?Socioeconomic History  ? Marital status: Single  ?  Spouse name: Not on file  ? Number of children: Not on file  ? Years of education: Not on file  ? Highest education level: Not on file  ?Occupational History  ? Not on file  ?Tobacco Use  ? Smoking status: Former  ?  Years: 7.00  ?  Types: Cigarettes  ? Smokeless tobacco: Never  ?Vaping Use  ? Vaping Use: Never used  ?Substance and Sexual Activity  ? Alcohol use: Not Currently  ?  Comment: occasionally; not now  ? Drug use: No  ?  Types: Marijuana  ?  Comment: LAST use was Sept 2016  ? Sexual activity: Yes  ?  Birth control/protection: None  ?Other Topics Concern  ? Not on file  ?Social History Narrative  ? Not on file  ? ?Social Determinants of Health  ? ?Financial Resource Strain: Low Risk   ? Difficulty of Paying Living Expenses: Not very hard  ?Food Insecurity: Food Insecurity Present  ? Worried About Charity fundraiser in the Last Year: Sometimes true  ? Ran Out of Food in the Last Year: Never true  ?Transportation Needs: No Transportation Needs  ? Lack of Transportation (Medical): No  ? Lack of Transportation (Non-Medical): No  ?Physical Activity: Insufficiently Active  ? Days of Exercise per Week: 3 days  ? Minutes of Exercise per Session: 10 min  ?Stress: No Stress Concern Present  ? Feeling of Stress : Only a little  ?Social Connections: Unknown  ? Frequency of Communication with Friends and Family: More than three times a week  ? Frequency of Social Gatherings with Friends and Family: Patient refused  ?  Attends Religious Services: Patient refused  ? Active Member of Clubs or Organizations: No  ? Attends Archivist Meetings: Never  ? Marital Status: Living with partner  ?Intimate Partner Violence: Not At Risk  ? Fear of Current or Ex-Partner: No  ? Emotionally Abused: No  ? Physically Abused: No  ? Sexually Abused: No  ? ?No current facility-administered medications on file prior to encounter.  ? ?Current Outpatient Medications on File Prior to Encounter  ?Medication Sig Dispense Refill  ? Blood Pressure Monitor MISC For regular home bp monitoring during pregnancy 1 each 0  ? metroNIDAZOLE (FLAGYL) 500 MG tablet Take 1 tablet (500 mg total) by mouth 2 (two) times daily. 14 tablet 0  ? NIFEdipine (PROCARDIA) 10 MG capsule Take 1 capsule (10 mg total) by mouth 3 (three) times daily. 90 capsule 0  ? Prenatal 6.75-0.2 MG TABS Take 1 tablet by mouth daily. 90 tablet 6  ? progesterone (PROMETRIUM) 200 MG capsule Place one capsule vaginally at bedtime 30 capsule 3  ? ?Allergies  ?Allergen Reactions  ? Peroxide [Hydrogen Peroxide] Hives  ? ? ?ROS:  ?Review of Systems  ?Gastrointestinal:  Negative for abdominal pain.  ?Genitourinary:  Negative for vaginal bleeding and vaginal discharge.  ? ?I have reviewed patient's Past Medical Hx,  Surgical Hx, Family Hx, Social Hx, medications and allergies.  ? ?Physical Exam  ?Patient Vitals for the past 24 hrs: ? BP Temp Temp src Pulse Resp SpO2 Height Weight  ?01/01/22 0934 95/62 98.6 ?F (37 ?C) Oral 97 15 100 % '5\' 4"'$  (1.626 m) 54 kg  ? ?Physical Exam ?Constitutional:   ?   Appearance: Normal appearance.  ?Neurological:  ?   Mental Status: She is alert and oriented to person, place, and time.  ?Psychiatric:     ?   Behavior: Behavior normal.  ? ? ?MDM ?Patient denies any concerning symptoms in need of emergent evaluation.  ?BMZ given ? ?ASSESSMENT ?MSE Complete ?BMZ given ? ?PLAN ? ?Return to MAU if symptoms worsen ?Pelvic rest ? ? ?Lezlie Lye, NP ?01/01/2022 9:44 AM   ?

## 2022-01-01 NOTE — MAU Note (Signed)
...  Elizabeth Bird is a 26 y.o. at 20w1dhere in MAU reporting: Here for second dose of betamethasone. Denies any pain. No VB or LOF. +FM. ? ?Pain score: denies pain ? ?FHT: 160 initial external ?Lab orders placed from triage: [place] ? ?

## 2022-01-10 ENCOUNTER — Inpatient Hospital Stay (HOSPITAL_COMMUNITY)
Admission: AD | Admit: 2022-01-10 | Discharge: 2022-01-10 | Disposition: A | Discharge status: Home / Self Care | Payer: Medicaid Other | Attending: Obstetrics & Gynecology | Admitting: Obstetrics & Gynecology

## 2022-01-10 ENCOUNTER — Other Ambulatory Visit: Payer: Self-pay

## 2022-01-10 ENCOUNTER — Encounter (HOSPITAL_COMMUNITY): Payer: Self-pay | Admitting: Obstetrics & Gynecology

## 2022-01-10 DIAGNOSIS — Z3A25 25 weeks gestation of pregnancy: Secondary | ICD-10-CM | POA: Diagnosis not present

## 2022-01-10 DIAGNOSIS — O4702 False labor before 37 completed weeks of gestation, second trimester: Secondary | ICD-10-CM | POA: Diagnosis not present

## 2022-01-10 DIAGNOSIS — R102 Pelvic and perineal pain: Secondary | ICD-10-CM | POA: Insufficient documentation

## 2022-01-10 DIAGNOSIS — Z3689 Encounter for other specified antenatal screening: Secondary | ICD-10-CM | POA: Insufficient documentation

## 2022-01-10 DIAGNOSIS — O26892 Other specified pregnancy related conditions, second trimester: Secondary | ICD-10-CM | POA: Diagnosis not present

## 2022-01-10 DIAGNOSIS — Z3480 Encounter for supervision of other normal pregnancy, unspecified trimester: Secondary | ICD-10-CM

## 2022-01-10 LAB — URINALYSIS, ROUTINE W REFLEX MICROSCOPIC
Bilirubin Urine: NEGATIVE
Glucose, UA: NEGATIVE mg/dL
Hgb urine dipstick: NEGATIVE
Ketones, ur: NEGATIVE mg/dL
Nitrite: NEGATIVE
Protein, ur: NEGATIVE mg/dL
Specific Gravity, Urine: 1.02 (ref 1.005–1.030)
pH: 6 (ref 5.0–8.0)

## 2022-01-10 LAB — FETAL FIBRONECTIN: Fetal Fibronectin: NEGATIVE

## 2022-01-10 LAB — WET PREP, GENITAL
Clue Cells Wet Prep HPF POC: NONE SEEN
Sperm: NONE SEEN
Trich, Wet Prep: NONE SEEN
WBC, Wet Prep HPF POC: <10 (ref <10)
Yeast Wet Prep HPF POC: NONE SEEN

## 2022-01-10 MED ORDER — LACTATED RINGERS IV BOLUS
1000.0000 mL | Freq: Once | INTRAVENOUS | Status: AC
  Administered 2022-01-10: 1000 mL via INTRAVENOUS

## 2022-01-10 MED ORDER — NIFEDIPINE 10 MG PO CAPS
10.0000 mg | ORAL_CAPSULE | ORAL | Status: AC | PRN
  Administered 2022-01-10 (×3): 10 mg via ORAL
  Filled 2022-01-10 (×3): qty 1

## 2022-01-10 NOTE — MAU Provider Note (Signed)
Patient Elizabeth Bird is a 26 y.o. G3P1001 ? At 37w3dhere with complaints of cramping. This is a worsening complaint. She reports strong fetal movements, she denies vaginal bleeding, she denies leaking of fluid. She has a history of preterm delivery at 18 weeks. She is on pelvic rest precautions. She is on procardia PRN and vaginal progesterone.  ?Last cervical length was 2.74 cm on 12/26/2021. She has received two courses of BMZ.  ?History  ?  ? ?CSN: [id number]? ?Arrival date and time: 01/10/22 0124 ? ? Event Date/Time  ? First Provider Initiated Contact with Patient 01/10/22 0244   ?  ? ?Chief Complaint  ?Patient presents with  ? Contractions  ? ?Abdominal Pain ?This is a recurrent problem. The current episode started in the past 7 days. The onset quality is gradual. The problem occurs constantly. The pain is located in the suprapubic region. The pain is at a severity of 3/10. Pain radiation: "all over" tightening. Relieved by: tried procardia 7 times and it didn't work.  ?She last took procardia at midnight and when it didn't work, she came in for evaluation.  ? ? ?OB History   ? ? Gravida  ?3  ? Para  ?2  ? Term  ?1  ? Preterm  ?   ? AB  ?0  ? Living  ?1  ?  ? ? SAB  ?0  ? IAB  ?   ? Ectopic  ?   ? Multiple  ?0  ? Live Births  ?1  ?   ?  ?  ? ? ?Past Medical History:  ?Diagnosis Date  ? Medical history non-contributory   ? ? ?Past Surgical History:  ?Procedure Laterality Date  ? NO PAST SURGERIES    ? ? ?Family History  ?Problem Relation Age of Onset  ? Multiple sclerosis Maternal Grandmother   ? Arthritis Maternal Grandmother   ? Suicidality Maternal Grandfather   ? Multiple sclerosis Mother   ? Thyroid disease Sister   ? Schizophrenia Sister   ? Bipolar disorder Sister   ? Post-traumatic stress disorder Sister   ? Asthma Maternal Aunt   ? Cancer Maternal Aunt   ?     colon  ? ? ?Social History  ? ?Tobacco Use  ? Smoking status: Former  ?  Years: 7.00  ?  Types: Cigarettes  ? Smokeless tobacco: Never  ?Vaping  Use  ? Vaping Use: Never used  ?Substance Use Topics  ? Alcohol use: Not Currently  ?  Comment: occasionally; not now  ? Drug use: No  ?  Types: Marijuana  ?  Comment: LAST use was Sept 2016  ? ? ?Allergies:  ?Allergies  ?Allergen Reactions  ? Peroxide [Hydrogen Peroxide] Hives  ? ? ?Medications Prior to Admission  ?Medication Sig Dispense Refill Last Dose  ? NIFEdipine (PROCARDIA) 10 MG capsule Take 1 capsule (10 mg total) by mouth 3 (three) times daily. 90 capsule 0 01/10/2022  ? Prenatal 6.75-0.2 MG TABS Take 1 tablet by mouth daily. 90 tablet 6 01/10/2022  ? progesterone (PROMETRIUM) 200 MG capsule Place one capsule vaginally at bedtime 30 capsule 3 01/10/2022  ? Blood Pressure Monitor MISC For regular home bp monitoring during pregnancy 1 each 0   ? metroNIDAZOLE (FLAGYL) 500 MG tablet Take 1 tablet (500 mg total) by mouth 2 (two) times daily. 14 tablet 0   ? ? ?Review of Systems  ?Constitutional: Negative.   ?HENT: Negative.    ?Gastrointestinal:  Positive for  abdominal pain.  ?Genitourinary: Negative.   ?Musculoskeletal: Negative.   ?Neurological: Negative.   ?Hematological: Negative.   ?Psychiatric/Behavioral: Negative.    ?Physical Exam  ? ?Blood pressure 108/60, pulse 97, temperature 99.1 ?F (37.3 ?C), temperature source Oral, resp. rate 16, height '5\' 4"'$  (1.626 m), weight 54.7 kg, SpO2 98 %, currently breastfeeding. ? ?Physical Exam ?Constitutional:   ?   Appearance: Normal appearance.  ?Cardiovascular:  ?   Rate and Rhythm: Normal rate.  ?Pulmonary:  ?   Effort: Pulmonary effort is normal.  ?Abdominal:  ?   General: Abdomen is flat.  ?Genitourinary: ?   General: Normal vulva.  ?   Comments: Cervix is 1.5; posterior, feels short but is not softening ?Musculoskeletal:     ?   General: Normal range of motion.  ?Skin: ?   General: Skin is warm.  ?Neurological:  ?   General: No focal deficit present.  ?   Mental Status: She is alert.  ?Psychiatric:     ?   Mood and Affect: Mood normal.  ? ? ?MAU Course   ?Procedures ? ?MDM ?Patient had IV fluid bolus and rested while in MAU.  ?NST: 140 bpm, mod var, present acel, no decels, occasional contractions ?-patient declines GC CT cultures ?-FFN was negative ?-wet prep was negative ? ?Reassessment (4:51 AM) ?Patient feels better and would like to go home; does not want cervix rechecked  ?Assessment and Plan  ? ?1. Supervision of other normal pregnancy, antepartum   ?2. Threatened preterm labor, second trimester   ?3. [redacted] weeks gestation of pregnancy   ? ?-patient stable for discharge with plans to keep fup appt on 3-15 ?-return precautions reviewed; patient verbalized understanding ? ? ?Mervyn Skeeters Clotine Heiner ?01/10/2022, 2:45 AM  ?

## 2022-01-10 NOTE — MAU Note (Signed)
Elizabeth Bird is a 26 y.o. at 80w3dhere in MAU reporting: Feeling ctx on and off and taking medication for it, but not sure it's helping. Also very tender on her labia and worse when she urinates. Took medication about 45 minutes ago.  ?LMP:  ?Onset of complaint: x 3 days ?Pain score: 3/10 ?Vitals:  ? 01/10/22 0142  ?BP: 108/60  ?Pulse: 97  ?Resp: 16  ?Temp: 99.1 ?F (37.3 ?C)  ?SpO2: 98%  ?   ?FHT:150 ?Lab orders placed from triage:  ? ?

## 2022-01-12 ENCOUNTER — Encounter: Payer: Self-pay | Admitting: Women's Health

## 2022-01-12 ENCOUNTER — Ambulatory Visit (INDEPENDENT_AMBULATORY_CARE_PROVIDER_SITE_OTHER): Payer: Medicaid Other | Admitting: Women's Health

## 2022-01-12 ENCOUNTER — Other Ambulatory Visit: Payer: Self-pay

## 2022-01-12 VITALS — BP 115/70 | HR 103 | Wt 122.0 lb

## 2022-01-12 DIAGNOSIS — Z3480 Encounter for supervision of other normal pregnancy, unspecified trimester: Secondary | ICD-10-CM

## 2022-01-12 DIAGNOSIS — Z3482 Encounter for supervision of other normal pregnancy, second trimester: Secondary | ICD-10-CM

## 2022-01-12 NOTE — Progress Notes (Signed)
? ? ?LOW-RISK PREGNANCY VISIT ?Patient name: Elizabeth Bird MRN [id number]  Date of birth: [birth year] ?Chief Complaint:   ?cervix check ? ?History of Present Illness:   ?Elizabeth Bird is a 26 y.o. G34P1011 female at 6w5dwith an Estimated Date of Delivery: 04/22/22 being seen today for ongoing management of a low-risk pregnancy.  ? ?Today she reports  some intermittent cramping, takes procardia as resolves. Has had a couple of good days the last few days . [place]  Contractions: Irregular. Vag. Bleeding: None.  Movement: Present. denies leaking of fluid. ? ?[facility] 10/07/2021 12/20/2017 12/04/2017  ?Decreased Interest 2 0 0  ?Down, Depressed, Hopeless 0 0 0  ?PHQ - 2 Score 2 0 0  ?Altered sleeping 1 1 -  ?Tired, decreased energy 1 1 -  ?Change in appetite 1 0 -  ?Feeling bad or failure about yourself  0 0 -  ?Trouble concentrating 0 0 -  ?Moving slowly or fidgety/restless 0 0 -  ?Suicidal thoughts 0 0 -  ?PHQ-9 Score 5 2 -  ? ?  ?GAD 7 : Generalized Anxiety Score 10/07/2021  ?Nervous, Anxious, on Edge 0  ?Control/stop worrying 0  ?Worry too much - different things 0  ?Trouble relaxing 1  ?Restless 0  ?Easily annoyed or irritable 1  ?Afraid - awful might happen 0  ?Total GAD 7 Score 2  ? ? ?  ?Review of Systems:   ?Pertinent items are noted in HPI ?Denies abnormal vaginal discharge w/ itching/odor/irritation, headaches, visual changes, shortness of breath, chest pain, abdominal pain, severe nausea/vomiting, or problems with urination or bowel movements unless otherwise stated above. ?Pertinent History Reviewed:  ?Reviewed past medical,surgical, social, obstetrical and family history.  ?Reviewed problem list, medications and allergies. ?Physical Assessment:  ? ?Vitals:  ? 01/12/22 0943  ?BP: 115/70  ?Pulse: (!) 103  ?Weight: 122 lb (55.3 kg)  ?Body mass index is 20.94 kg/m?. ?  ?     Physical Examination:  ? General appearance: Well appearing, and in no distress ? Mental  status: Alert, oriented to person, place, and time ? Skin: Warm & dry ? Cardiovascular: Normal heart rate noted ? Respiratory: Normal respiratory effort, no distress ? Abdomen: Soft, gravid, nontender ? Pelvic: Cervical exam deferred        ? Extremities: Edema: None ? ?Fetal Status: Fetal Heart Rate (bpm): 148 Fundal Height: 25 cm Movement: Present   ? ?Chaperone: N/A   ?No results found for this or any previous visit (from the past 24 hour(s)).  ?Assessment & Plan:  ?1) Low-risk pregnancy G4P1011 at 225w5dith an Estimated Date of Delivery: 04/22/22  ? ?2) Recent preterm labor, last SVE 1.5, neg fFN on 3/13. CL 2.7cm @ 23wk. Received BMZ 3/3 & 3/4, takes procardia prn. Stay hydrated, [facility] not [facility] ?3) [place] strongly advised against this d/t recent PTL ? ?4) H/O 16wk PPROM/PTB ?  ?Meds: No orders of the defined types were placed in this encounter. ? ?Labs/procedures today: none ? ?Plan:  Continue routine obstetrical care  ?Next visit: prefers will be in person for pn2    ? ?Reviewed: Preterm labor symptoms and general obstetric precautions including but not limited to vaginal bleeding, contractions, leaking of fluid and fetal movement were reviewed in detail with the patient.  All questions were answered. Does have home bp cuff. Office bp cuff given: not applicable. Check bp  weekly, let us know if consistently >140 and/or >90. ? ?Follow-up: Return in about 2 weeks (around 01/26/2022) for LROB, PN2, MD or CNM, in person. ? ?Future Appointments  ?Date Time Provider [place]  ?01/26/2022  9:00 AM [facility]  ?01/26/2022 10:10 AM Myrtis Ser, CNM [facility]  ? ? ?No orders of the defined types were placed in this encounter. ? ?Roma Schanz CNM, WHNP-BC ?01/12/2022 ?10:15 AM  ?

## 2022-01-12 NOTE — Patient Instructions (Signed)
Elizabeth Bird, thank you for choosing our office today! We appreciate the opportunity to meet your healthcare needs. You may receive a short survey by mail, e-mail, or through [employer]. If you are happy with your care we would appreciate if you could take just a few minutes to complete the survey questions. We read all of your comments and take your feedback very seriously. Thank you again for choosing our office.  ?Center for [employer] Team at [facility] ? ?Women's & [place] at [facility] ?([address] [place], [place] [address]) ?Entrance C, located off of [address] ?Free 24/7 valet parking  ? ?You will have your sugar test next visit.  Please do not eat or drink anything after midnight the night before you come, not even water.  You will be here for at least two hours.  Please make an appointment online for the bloodwork at [email or web address] for 8:00am (or as close to this as possible). Make sure you select the [facility] service center.  ? ?CLASSES: Go to [email or web address] to register for classes (childbirth, breastfeeding, waterbirth, infant CPR, daddy bootcamp, etc.) ? ?Call the office [phone]) or go to [facility] if: ?You begin to have strong, frequent contractions ?Your water breaks.  Sometimes it is a big gush of fluid, sometimes it is just a trickle that keeps getting your panties wet or running down your legs ?You have vaginal bleeding.  It is normal to have a small amount of spotting if your cervix was checked.  ?You don't feel your baby moving like normal.  If you don't, get you something to eat and drink and lay down and focus on feeling your baby move.   If your baby is still not moving like normal, you should call the office or go to [facility]. ? ?Call the office [phone]) or go to [facility] hospital for these signs of pre-eclampsia: ?Severe headache that does not go away with Tylenol ?Visual changes- seeing spots, double, blurred vision ?Pain under your right breast or upper  abdomen that does not go away with Tums or heartburn medicine ?Nausea and/or vomiting ?Severe swelling in your hands, feet, and face  ? ? ?[place] Pediatricians/Family Doctors ?[facility]): [address] [place] [address], [phone]           ?[facility]: [address], [phone]                ?[place] [facility]): [place], [phone] (call to ask if accepting patients) ?[facility] Department: [address], [place], [place]   ? ?[place] Pediatricians/Family Doctors ?[facility]): [address], [phone] ?[place]: [address] [place], [phone] ?[facility] [place]: [place], [phone] ? ?[place]  ?[facility]): [phone] ?[facility]: [place], [phone]  ? ?[place] ?[place]: [place] [address] [phone] ? ?[place]  ?[facility]: [phone], [phone] ? ?Home Blood Pressure Monitoring for Patients  ? ?Your provider has recommended that you check your blood pressure (BP) at least once a week at home. If you do not have a blood pressure cuff at home, one will be provided for you. Contact your provider if you have not received your monitor within 1 week.  ? ?Helpful Tips for Accurate Home Blood Pressure Checks  ?Don't smoke, exercise, or drink  caffeine 30 minutes before checking your BP ?Use the restroom before checking your BP (a full bladder can raise your pressure) ?Relax in a comfortable upright chair ?Feet on the ground ?Left arm resting comfortably on a flat surface at the level of your heart ?Legs uncrossed ?Back supported ?Sit quietly and don't talk ?Place the cuff on your bare arm ?Adjust snuggly, so that only two fingertips can fit between your skin and the top of the cuff ?Check 2  readings separated by at least one minute ?Keep a log of your BP readings ?For a visual, please reference this diagram: http://ccnc.care/bpdiagram ? ?Provider Name: [facility]     Phone: [phone] ? ?Zone 1: ALL CLEAR  ?Continue to monitor your symptoms:  ?BP reading is less than 140 (top number) or less than 90 (bottom number)  ?No right upper stomach pain ?No headaches or seeing spots ?No feeling nauseated or throwing up ?No swelling in face and hands ? ?Zone 2: CAUTION ?Call your doctor's office for any of the following:  ?BP reading is greater than 140 (top number) or greater than 90 (bottom number)  ?Stomach pain under your ribs in the middle or right side ?Headaches or seeing spots ?Feeling nauseated or throwing up ?Swelling in face and hands ? ?Zone 3: EMERGENCY  ?Seek immediate medical care if you have any of the following:  ?BP reading is greater than160 (top number) or greater than 110 (bottom number) ?Severe headaches not improving with Tylenol ?Serious difficulty catching your breath ?Any worsening symptoms from Zone 2  ? ?Second Trimester of Pregnancy ?The second trimester is from week 13 through week 28, months 4 through 6. The second trimester is often a time when you feel your best. Your body has also adjusted to being pregnant, and you begin to feel better physically. Usually, morning sickness has lessened or quit completely, you may have more energy, and you may have an increase in appetite. The second trimester is also a time when the fetus is growing rapidly. At the end of the sixth month, the fetus is about 9 inches long and weighs about 1? pounds. You will likely begin to feel the baby move (quickening) between 18 and 20 weeks of the pregnancy. ?BODY CHANGES ?Your body goes through many changes during pregnancy. The changes vary from woman to woman.  ?Your weight will continue to increase. You will notice your lower abdomen bulging out. ?You may begin to get stretch marks on your  hips, abdomen, and breasts. ?You may develop headaches that can be relieved by medicines approved by your health care provider. ?You may urinate more often because the fetus is pressing on your bladder. ?You may develop or continue to have heartburn as a result of your pregnancy. ?You may develop constipation because certain hormones are causing the muscles that push waste through your intestines to slow down. ?You may develop hemorrhoids or swollen, bulging veins (varicose veins). ?You may have back pain because of the weight gain and pregnancy hormones relaxing your joints between the bones in your pelvis and as a result of a shift in weight and the muscles that support your balance. ?Your breasts will continue to grow and be tender. ?Your gums may bleed and may be sensitive to brushing and flossing. ?Dark spots or blotches (chloasma, mask of pregnancy) may develop on your face. This will likely fade after the baby is born. ?A dark line from your belly button to the pubic area (linea nigra) may appear. This  will likely fade after the baby is born. ?You may have changes in your hair. These can include thickening of your hair, rapid growth, and changes in texture. Some women also have hair loss during or after pregnancy, or hair that feels dry or thin. Your hair will most likely return to normal after your baby is born. ?WHAT TO EXPECT AT YOUR PRENATAL VISITS ?During a routine prenatal visit: ?You will be weighed to make sure you and the fetus are growing normally. ?Your blood pressure will be taken. ?Your abdomen will be measured to track your baby's growth. ?The fetal heartbeat will be listened to. ?Any test results from the previous visit will be discussed. ?Your health care provider may ask you: ?How you are feeling. ?If you are feeling the baby move. ?If you have had any abnormal symptoms, such as leaking fluid, bleeding, severe headaches, or abdominal cramping. ?If you have any questions. ?Other tests that may  be performed during your second trimester include: ?Blood tests that check for: ?Low iron levels (anemia). ?Gestational diabetes (between 24 and 28 weeks). ?Rh antibodies. ?Urine tests to check for infection

## 2022-01-26 ENCOUNTER — Other Ambulatory Visit: Payer: Medicaid Other

## 2022-01-26 ENCOUNTER — Other Ambulatory Visit: Payer: Self-pay

## 2022-01-26 ENCOUNTER — Encounter: Payer: Self-pay | Admitting: Advanced Practice Midwife

## 2022-01-26 ENCOUNTER — Ambulatory Visit (INDEPENDENT_AMBULATORY_CARE_PROVIDER_SITE_OTHER): Payer: Medicaid Other | Admitting: Advanced Practice Midwife

## 2022-01-26 VITALS — BP 99/63 | HR 89 | Wt 127.0 lb

## 2022-01-26 DIAGNOSIS — Z3A27 27 weeks gestation of pregnancy: Secondary | ICD-10-CM

## 2022-01-26 DIAGNOSIS — Z3482 Encounter for supervision of other normal pregnancy, second trimester: Secondary | ICD-10-CM

## 2022-01-26 DIAGNOSIS — Z23 Encounter for immunization: Secondary | ICD-10-CM

## 2022-01-26 DIAGNOSIS — Z3480 Encounter for supervision of other normal pregnancy, unspecified trimester: Secondary | ICD-10-CM

## 2022-01-26 DIAGNOSIS — Z131 Encounter for screening for diabetes mellitus: Secondary | ICD-10-CM

## 2022-01-26 MED ORDER — NIFEDIPINE 10 MG PO CAPS: 10.0000 mg | ORAL_CAPSULE | Freq: Three times a day (TID) | ORAL | 3 refills | Status: DC

## 2022-01-26 NOTE — Patient Instructions (Signed)
Elizabeth Bird, thank you for choosing our office today! We appreciate the opportunity to meet your healthcare needs. You may receive a short survey by mail, e-mail, or through [employer]. If you are happy with your care we would appreciate if you could take just a few minutes to complete the survey questions. We read all of your comments and take your feedback very seriously. Thank you again for choosing our office.  ?Center for [employer] Team at [facility] ? ?Women's & [place] at [facility] ?([address] [place], [place] [address]) ?Entrance C, located off of [address] ?Free 24/7 valet parking  ? ?CLASSES: Go to [email or web address] to register for classes (childbirth, breastfeeding, waterbirth, infant CPR, daddy bootcamp, etc.) ? ?Call the office [phone]) or go to [facility] if: ?You begin to have strong, frequent contractions ?Your water breaks.  Sometimes it is a big gush of fluid, sometimes it is just a trickle that keeps getting your panties wet or running down your legs ?You have vaginal bleeding.  It is normal to have a small amount of spotting if your cervix was checked.  ?You don't feel your baby moving like normal.  If you don't, get you something to eat and drink and lay down and focus on feeling your baby move.   If your baby is still not moving like normal, you should call the office or go to [facility]. ? ?Call the office [phone]) or go to [facility] hospital for these signs of pre-eclampsia: ?Severe headache that does not go away with Tylenol ?Visual changes- seeing spots, double, blurred vision ?Pain under your right breast or upper abdomen that does not go away with Tums or heartburn medicine ?Nausea and/or vomiting ?Severe swelling in your hands, feet, and face  ? ?Tdap Vaccine ?It is recommended that you get the Tdap vaccine during the third trimester of EACH pregnancy to help protect your baby from getting pertussis (whooping cough) ?27-36 weeks is the BEST time to do  this so that you can pass the protection on to your baby. During pregnancy is better than after pregnancy, but if you are unable to get it during pregnancy it will be offered at the hospital.  ?You can get this vaccine with [place], at the health department, your family doctor, or some local pharmacies ?Everyone who will be around your baby should also be up-to-date on their vaccines before the baby comes. Adults (who are not pregnant) only need 1 dose of Tdap during adulthood.  ? ?[place] Pediatricians/Family Doctors ?[facility]): [address] [place] [address], [phone]           ?[facility]: [address], [phone]                ?[facility]): [place], [phone] (call to ask if accepting patients) ?[facility] Department: [address], [place], [place]   ? ?[place] Pediatricians/Family Doctors ?[facility]): [address], [phone] ?[place]: [address] [place], [phone] ?[facility] [place]: [place], [phone] ? ?[place]  ?[facility]): [phone] ?[facility]: [place], [phone]  ? ?[place] ?[place]: [place] [address] [phone] ? ?[place]  ?[facility]: [phone], [phone] ? ?Home Blood Pressure Monitoring for Patients  ? ?Your provider has recommended that you check your  blood pressure (BP) at least once a week at home. If you do not have a blood pressure cuff at home, one will be provided for you. Contact your provider if you have not received your monitor within 1 week.  ? ?Helpful Tips for Accurate Home Blood Pressure Checks  ?Don't smoke, exercise, or drink caffeine 30 minutes before checking your BP ?Use the restroom before checking your BP (a full bladder can raise your  pressure) ?Relax in a comfortable upright chair ?Feet on the ground ?Left arm resting comfortably on a flat surface at the level of your heart ?Legs uncrossed ?Back supported ?Sit quietly and don't talk ?Place the cuff on your bare arm ?Adjust snuggly, so that only two fingertips can fit between your skin and the top of the cuff ?Check 2 readings separated by at least one minute ?Keep a log of your BP readings ?For a visual, please reference this diagram: http://ccnc.care/bpdiagram ? ?Provider Name: [facility]     Phone: [phone] ? ?Zone 1: ALL CLEAR  ?Continue to monitor your symptoms:  ?BP reading is less than 140 (top number) or less than 90 (bottom number)  ?No right upper stomach pain ?No headaches or seeing spots ?No feeling nauseated or throwing up ?No swelling in face and hands ? ?Zone 2: CAUTION ?Call your doctor's office for any of the following:  ?BP reading is greater than 140 (top number) or greater than 90 (bottom number)  ?Stomach pain under your ribs in the middle or right side ?Headaches or seeing spots ?Feeling nauseated or throwing up ?Swelling in face and hands ? ?Zone 3: EMERGENCY  ?Seek immediate medical care if you have any of the following:  ?BP reading is greater than160 (top number) or greater than 110 (bottom number) ?Severe headaches not improving with Tylenol ?Serious difficulty catching your breath ?Any worsening symptoms from Zone 2  ? ?Third Trimester of Pregnancy ?The third trimester is from week 29 through week 42, months 7 through 9. The third trimester is a time when the fetus is growing rapidly. At the end of the ninth month, the fetus is about 20 inches in length and weighs 6-10 pounds.  ?BODY CHANGES ?Your body goes through many changes during pregnancy. The changes vary from woman to woman.  ?Your weight will continue to increase. You can expect to gain 25-35 pounds (11-16 kg) by the end of the pregnancy. ?You may begin to get stretch marks on your hips, abdomen,  and breasts. ?You may urinate more often because the fetus is moving lower into your pelvis and pressing on your bladder. ?You may develop or continue to have heartburn as a result of your pregnancy. ?You may develop constipation because certain hormones are causing the muscles that push waste through your intestines to slow down. ?You may develop hemorrhoids or swollen, bulging veins (varicose veins). ?You may have pelvic pain because of the weight gain and pregnancy hormones relaxing your joints between the bones in your pelvis. Backaches may result from overexertion of the muscles supporting your posture. ?You may have changes in your hair. These can include thickening of your hair, rapid growth, and changes in texture. Some women also have hair loss during or after pregnancy, or hair that feels dry or thin. Your hair will most likely return to normal after your baby is born. ?Your breasts will continue to grow and be tender. A yellow discharge may leak from your breasts called colostrum. ?Your belly button may stick out. ?You may  feel short of breath because of your expanding uterus. ?You may notice the fetus "dropping," or moving lower in your abdomen. ?You may have a bloody mucus discharge. This usually occurs a few days to a week before labor begins. ?Your cervix becomes thin and soft (effaced) near your due date. ?WHAT TO EXPECT AT [place]  ?You will have prenatal exams every 2 weeks until week 36. Then, you will have weekly prenatal exams. During a routine prenatal visit: ?You will be weighed to make sure you and the fetus are growing normally. ?Your blood pressure is taken. ?Your abdomen will be measured to track your baby's growth. ?The fetal heartbeat will be listened to. ?Any test results from the previous visit will be discussed. ?You may have a cervical check near your due date to see if you have effaced. ?At around 36 weeks, your caregiver will check your cervix. At the same time, your  caregiver will also perform a test on the secretions of the vaginal tissue. This test is to determine if a type of bacteria, Group B streptococcus, is present. Your caregiver will explain this further. ?Your

## 2022-01-26 NOTE — Progress Notes (Signed)
? ?  LOW-RISK PREGNANCY VISIT ?Patient name: Elizabeth Bird MRN [id number]  Date of birth: [birth year] ?Chief Complaint:   ?Routine Prenatal Visit (PN2) ? ?History of Present Illness:   ?Elizabeth Bird is a 26 y.o. G17P1011 female at 75w5dwith an Estimated Date of Delivery: 04/22/22 being seen today for ongoing management of a low-risk pregnancy.  ?Today she reports  had a good 1-2wks with minimal cramping until the past 2 days; drinks Gatorade/Propel to keep them at [place]. Contractions: Irritability. Vag. Bleeding: None.  Movement: Present. denies leaking of fluid. ?Review of Systems:   ?Pertinent items are noted in HPI ?Denies abnormal vaginal discharge w/ itching/odor/irritation, headaches, visual changes, shortness of breath, chest pain, abdominal pain, severe nausea/vomiting, or problems with urination or bowel movements unless otherwise stated above. ?Pertinent History Reviewed:  ?Reviewed past medical,surgical, social, obstetrical and family history.  ?Reviewed problem list, medications and allergies. ?Physical Assessment:  ? ?Vitals:  ? 01/26/22 1033  ?BP: 99/63  ?Pulse: 89  ?Weight: 127 lb (57.6 kg)  ?Body mass index is 21.8 kg/m?. ?  ?     Physical Examination:  ? General appearance: Well appearing, and in no distress ? Mental status: Alert, oriented to person, place, and time ? Skin: Warm & dry ? Cardiovascular: Normal heart rate noted ? Respiratory: Normal respiratory effort, no distress ? Abdomen: Soft, gravid, nontender ? Pelvic: Cervical exam deferred        ? Extremities: Edema: None ? ?Fetal Status: Fetal Heart Rate (bpm): 133 Fundal Height: 27 cm Movement: Present   ? ?No results found for this or any previous visit (from the past 24 hour(s)).  ?Assessment & Plan:  ?1) Low-risk pregnancy G4P1011 at 232w5dith an Estimated Date of Delivery: 04/22/22  ? ?2) Recent PTL, taking Procardia prn and staying hydrated ? ?3) Considering BTL, will sign papers today; other option is Nexplanon but she has concerns re  being able to afford a replacement in 3y16yr  ?Meds:  ?Meds ordered this encounter  ?Medications  ? NIFEdipine (PROCARDIA) 10 MG capsule  ?  Sig: Take 1 capsule (10 mg total) by mouth 3 (three) times daily.  ?  Dispense:  90 capsule  ?  Refill:  3  ?  Order Specific Question:   Supervising Provider  ?  Answer:  Janyth Pupa0[[id number]] ?Labs/procedures today: Tdap ? ?Plan:  Continue routine obstetrical care  ? ?Reviewed: Preterm labor symptoms and general obstetric precautions including but not limited to vaginal bleeding, contractions, leaking of fluid and fetal movement were reviewed in detail with the patient.  All questions were answered. Didn't ask about home bp cuff.  Check bp weekly, let us [place] if >140/90.  ? ?Follow-up: Return in about 2 weeks (around 02/09/2022) for LROB, in person, Sign BTL consent today. ? ?Orders Placed This Encounter  ?Procedures  ? Tdap vaccine greater than or equal to 7yo IM  ? ?KimMyrtis SerM ?01/26/2022 ?11:09 AM  ?

## 2022-01-27 LAB — CBC
Hematocrit: 32.7 % — ABNORMAL LOW (ref 34.0–46.6)
Hemoglobin: 10.8 g/dL — ABNORMAL LOW (ref 11.1–15.9)
MCH: 30.9 pg (ref 26.6–33.0)
MCHC: 33 g/dL (ref 31.5–35.7)
MCV: 94 fL (ref 79–97)
Platelets: 320 10*3/uL (ref 150–450)
RBC: 3.49 x10E6/uL — ABNORMAL LOW (ref 3.77–5.28)
RDW: 11.8 % (ref 11.7–15.4)
WBC: 8.8 10*3/uL (ref 3.4–10.8)

## 2022-01-27 LAB — GLUCOSE TOLERANCE, 2 HOURS W/ 1HR
Glucose, 1 hour: 100 mg/dL (ref 70–179)
Glucose, 2 hour: 93 mg/dL (ref 70–152)
Glucose, Fasting: 77 mg/dL (ref 70–91)

## 2022-01-27 LAB — HIV ANTIBODY (ROUTINE TESTING W REFLEX): HIV Screen 4th Generation wRfx: NONREACTIVE

## 2022-01-27 LAB — RPR: RPR Ser Ql: NONREACTIVE

## 2022-01-27 LAB — ANTIBODY SCREEN: Antibody Screen: NEGATIVE

## 2022-02-06 ENCOUNTER — Encounter (HOSPITAL_COMMUNITY): Payer: Self-pay | Admitting: Obstetrics and Gynecology

## 2022-02-06 ENCOUNTER — Inpatient Hospital Stay (HOSPITAL_COMMUNITY)
Admission: AD | Admit: 2022-02-06 | Discharge: 2022-02-06 | Disposition: A | Discharge status: Home / Self Care | Payer: Medicaid Other | Attending: Obstetrics and Gynecology | Admitting: Obstetrics and Gynecology

## 2022-02-06 DIAGNOSIS — B3731 Acute candidiasis of vulva and vagina: Secondary | ICD-10-CM | POA: Insufficient documentation

## 2022-02-06 DIAGNOSIS — O4703 False labor before 37 completed weeks of gestation, third trimester: Secondary | ICD-10-CM | POA: Insufficient documentation

## 2022-02-06 DIAGNOSIS — N760 Acute vaginitis: Secondary | ICD-10-CM | POA: Diagnosis not present

## 2022-02-06 DIAGNOSIS — Z3A29 29 weeks gestation of pregnancy: Secondary | ICD-10-CM | POA: Diagnosis not present

## 2022-02-06 DIAGNOSIS — B9689 Other specified bacterial agents as the cause of diseases classified elsewhere: Secondary | ICD-10-CM

## 2022-02-06 DIAGNOSIS — O98813 Other maternal infectious and parasitic diseases complicating pregnancy, third trimester: Secondary | ICD-10-CM | POA: Diagnosis not present

## 2022-02-06 DIAGNOSIS — O23593 Infection of other part of genital tract in pregnancy, third trimester: Secondary | ICD-10-CM | POA: Diagnosis not present

## 2022-02-06 DIAGNOSIS — O4700 False labor before 37 completed weeks of gestation, unspecified trimester: Secondary | ICD-10-CM

## 2022-02-06 LAB — URINALYSIS, ROUTINE W REFLEX MICROSCOPIC
Bilirubin Urine: NEGATIVE
Glucose, UA: NEGATIVE mg/dL
Hgb urine dipstick: NEGATIVE
Ketones, ur: NEGATIVE mg/dL
Leukocytes,Ua: NEGATIVE
Nitrite: NEGATIVE
Protein, ur: NEGATIVE mg/dL
Specific Gravity, Urine: 1.017 (ref 1.005–1.030)
pH: 6 (ref 5.0–8.0)

## 2022-02-06 LAB — WET PREP, GENITAL
Sperm: NONE SEEN
Trich, Wet Prep: NONE SEEN
WBC, Wet Prep HPF POC: <10 (ref <10)
Yeast Wet Prep HPF POC: NONE SEEN

## 2022-02-06 MED ORDER — NIFEDIPINE 10 MG PO CAPS
10.0000 mg | ORAL_CAPSULE | Freq: Three times a day (TID) | ORAL | Status: DC
Start: 2022-02-06 — End: 2022-02-06

## 2022-02-06 MED ORDER — TERCONAZOLE 0.4 % VA CREA: 1.0000 | TOPICAL_CREAM | Freq: Every day | VAGINAL | 0 refills | Status: AC

## 2022-02-06 MED ORDER — METRONIDAZOLE 0.75 % VA GEL: 1.0000 | Freq: Every day | VAGINAL | 0 refills | Status: AC

## 2022-02-06 MED ORDER — LACTATED RINGERS IV BOLUS
1000.0000 mL | Freq: Once | INTRAVENOUS | Status: AC
  Administered 2022-02-06: 1000 mL via INTRAVENOUS
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
## 2022-02-06 NOTE — Discharge Instructions (Signed)

## 2022-02-09 ENCOUNTER — Ambulatory Visit (INDEPENDENT_AMBULATORY_CARE_PROVIDER_SITE_OTHER): Payer: Medicaid Other | Admitting: Women's Health

## 2022-02-09 ENCOUNTER — Encounter: Payer: Self-pay | Admitting: Women's Health

## 2022-02-09 VITALS — BP 102/63 | HR 91 | Wt 131.0 lb

## 2022-02-09 DIAGNOSIS — Z3480 Encounter for supervision of other normal pregnancy, unspecified trimester: Secondary | ICD-10-CM

## 2022-02-09 DIAGNOSIS — Z3483 Encounter for supervision of other normal pregnancy, third trimester: Secondary | ICD-10-CM

## 2022-02-09 NOTE — Progress Notes (Signed)
? ? ?LOW-RISK PREGNANCY VISIT ?Patient name: Elizabeth Bird MRN [id number]  Date of birth: [birth year] ?Chief Complaint:   ?Routine Prenatal Visit ? ?History of Present Illness:   ?Elizabeth Bird is a 26 y.o. G83P1011 female at 56w5dwith an Estimated Date of Delivery: 04/22/22 being seen today for ongoing management of a low-risk pregnancy.  ? ?Today she reports  went to MAU 4/9 w/ contractions, given IVF and procardia, cx unchanged, wet prep +BV- given metrogel and also treated for yeast. Has prn procardia at home, doesn't take ofen . Contractions: Irritability. Vag. Bleeding: None.  Movement: Present. denies leaking of fluid. ? ? ?  01/26/2022  ? 10:23 AM 10/07/2021  ?  2:24 PM 12/20/2017  ?  2:48 PM 12/04/2017  ? 11:41 AM  ?Depression screen PHQ 2/9  ?Decreased Interest 0 2 0 0  ?Down, Depressed, Hopeless 0 0 0 0  ?PHQ - 2 Score 0 2 0 0  ?Altered sleeping 0 1 1   ?Tired, decreased energy '1 1 1   '$ ?Change in appetite 0 1 0   ?Feeling bad or failure about yourself  0 0 0   ?Trouble concentrating 0 0 0   ?Moving slowly or fidgety/restless 0 0 0   ?Suicidal thoughts 0 0 0   ?PHQ-9 Score '1 5 2   '$ ? ?  ? ?  10/07/2021  ?  2:24 PM  ?GAD 7 : Generalized Anxiety Score  ?Nervous, Anxious, on Edge 0  ?Control/stop worrying 0  ?Worry too much - different things 0  ?Trouble relaxing 1  ?Restless 0  ?Easily annoyed or irritable 1  ?Afraid - awful might happen 0  ?Total GAD 7 Score 2  ? ? ?  ?Review of Systems:   ?Pertinent items are noted in HPI ?Denies abnormal vaginal discharge w/ itching/odor/irritation, headaches, visual changes, shortness of breath, chest pain, abdominal pain, severe nausea/vomiting, or problems with urination or bowel movements unless otherwise stated above. ?Pertinent History Reviewed:  ?Reviewed past medical,surgical, social, obstetrical and family history.  ?Reviewed problem list, medications and allergies. ?Physical Assessment:  ? ?Vitals:  ? 02/09/22 0837  ?BP: 102/63  ?Pulse: 91  ?Weight: 131 lb (59.4 kg)   ?Body mass index is 22.49 kg/m?. ?  ?     Physical Examination:  ? General appearance: Well appearing, and in no distress ? Mental status: Alert, oriented to person, place, and time ? Skin: Warm & dry ? Cardiovascular: Normal heart rate noted ? Respiratory: Normal respiratory effort, no distress ? Abdomen: Soft, gravid, nontender ? Pelvic: Cervical exam deferred        ? Extremities: Edema: None ? ?Fetal Status: Fetal Heart Rate (bpm): 152 Fundal Height: 28 cm Movement: Present   ? ?Chaperone: N/A   ?No results found for this or any previous visit (from the past 24 hour(s)).  ?Assessment & Plan:  ?1) Low-risk pregnancy G4P1011 at 243w5dith an Estimated Date of Delivery: 04/22/22  ? ?2) Recent ptl, stable, procardia prn, reviewed ptl s/s, reasons to seek care, s/p BMZ 3/3/ & 3/4 ? ?3) H/O 16wk PPROM/PTB ?  ?Meds: No orders of the defined types were placed in this encounter. ? ?Labs/procedures today: none ? ?Plan:  Continue routine obstetrical care  ?Next visit: prefers in person   ? ?Reviewed: Preterm labor symptoms and general obstetric precautions including but not limited to vaginal bleeding, contractions, leaking of fluid and fetal movement were reviewed in detail with the patient.  All questions were answered. Does have home  bp cuff. Office bp cuff given: not applicable. Check bp weekly, let us know if consistently >140 and/or >90. ? ?Follow-up: Return in about 2 weeks (around 02/23/2022) for [place]. ? ?Future Appointments  ?Date Time Provider [place]  ?02/23/2022 11:10 AM Roma Schanz, CNM [facility]  ? ? ?No orders of the defined types were placed in this encounter. ? ?Roma Schanz CNM, WHNP-BC ?02/09/2022 ?9:09 AM  ?

## 2022-02-09 NOTE — Patient Instructions (Signed)
Elizabeth Bird, thank you for choosing our office today! We appreciate the opportunity to meet your healthcare needs. You may receive a short survey by mail, e-mail, or through [employer]. If you are happy with your care we would appreciate if you could take just a few minutes to complete the survey questions. We read all of your comments and take your feedback very seriously. Thank you again for choosing our office.  ?Center for [employer] Team at [facility] ? ?Women's & [place] at [facility] ?([address] [place], [place] [address]) ?Entrance C, located off of [address] ?Free 24/7 valet parking  ? ?CLASSES: Go to [email or web address] to register for classes (childbirth, breastfeeding, waterbirth, infant CPR, daddy bootcamp, etc.) ? ?Call the office ([phone]) or go to [facility] if: ?You begin to have strong, frequent contractions ?Your water breaks.  Sometimes it is a big gush of fluid, sometimes it is just a trickle that keeps getting your panties wet or running down your legs ?You have vaginal bleeding.  It is normal to have a small amount of spotting if your cervix was checked.  ?You don't feel your baby moving like normal.  If you don't, get you something to eat and drink and lay down and focus on feeling your baby move.   If your baby is still not moving like normal, you should call the office or go to [facility]. ? ?Call the office [phone]) or go to [facility] hospital for these signs of pre-eclampsia: ?Severe headache that does not go away with Tylenol ?Visual changes- seeing spots, double, blurred vision ?Pain under your right breast or upper abdomen that does not go away with Tums or heartburn medicine ?Nausea and/or vomiting ?Severe swelling in your hands, feet, and face  ? ?Tdap Vaccine ?It is recommended that you get the Tdap vaccine during the third trimester of EACH pregnancy to help protect your baby from getting pertussis (whooping cough) ?27-36 weeks is the BEST time to do  this so that you can pass the protection on to your baby. During pregnancy is better than after pregnancy, but if you are unable to get it during pregnancy it will be offered at the hospital.  ?You can get this vaccine with [place], at the health department, your family doctor, or some local pharmacies ?Everyone who will be around your baby should also be up-to-date on their vaccines before the baby comes. Adults (who are not pregnant) only need 1 dose of Tdap during adulthood.  ? ?[place] Pediatricians/Family Doctors ?[facility]): [address] [place] [address], [phone]           ?[facility]: [address], [phone]                ?[facility]): [place], [phone] (call to ask if accepting patients) ?[facility] Department: [address], [place], [place]   ? ?[place] Pediatricians/Family Doctors ?[facility]): [address], [address], [phone] ?[place]: [address] [place], [phone] ?[facility] [place]: [place], [phone] ? ?[place]  ?[facility]): [phone] ?[facility]: [place], [phone]  ? ?[place] ?[place]: [place] [address] [phone] ? ?[place]  ?[facility]: [phone], [phone] ? ?Home Blood Pressure Monitoring for Patients  ? ?Your provider has recommended that you check your  blood pressure (BP) at least once a week at home. If you do not have a blood pressure cuff at home, one will be provided for you. Contact your provider if you have not received your monitor within 1 week.  ? ?Helpful Tips for Accurate Home Blood Pressure Checks  ?Don't smoke, exercise, or drink caffeine 30 minutes before checking your BP ?Use the restroom before checking your BP (a full bladder can raise your  pressure) ?Relax in a comfortable upright chair ?Feet on the ground ?Left arm resting comfortably on a flat surface at the level of your heart ?Legs uncrossed ?Back supported ?Sit quietly and don't talk ?Place the cuff on your bare arm ?Adjust snuggly, so that only two fingertips can fit between your skin and the top of the cuff ?Check 2 readings separated by at least one minute ?Keep a log of your BP readings ?For a visual, please reference this diagram: http://ccnc.care/bpdiagram ? ?Provider Name: [facility]     Phone: [phone] ? ?Zone 1: ALL CLEAR  ?Continue to monitor your symptoms:  ?BP reading is less than 140 (top number) or less than 90 (bottom number)  ?No right upper stomach pain ?No headaches or seeing spots ?No feeling nauseated or throwing up ?No swelling in face and hands ? ?Zone 2: CAUTION ?Call your doctor's office for any of the following:  ?BP reading is greater than 140 (top number) or greater than 90 (bottom number)  ?Stomach pain under your ribs in the middle or right side ?Headaches or seeing spots ?Feeling nauseated or throwing up ?Swelling in face and hands ? ?Zone 3: EMERGENCY  ?Seek immediate medical care if you have any of the following:  ?BP reading is greater than160 (top number) or greater than 110 (bottom number) ?Severe headaches not improving with Tylenol ?Serious difficulty catching your breath ?Any worsening symptoms from Zone 2  ?Preterm Labor and Birth Information ? ?The normal length of a pregnancy is 39-41 weeks. Preterm labor is when labor starts before 37 completed weeks of pregnancy. ?What are the risk factors for preterm labor? ?Preterm labor is more likely to occur in women who: ?Have certain infections during pregnancy such as a bladder infection, sexually transmitted infection, or infection inside the uterus (chorioamnionitis). ?Have a shorter-than-normal cervix. ?Have gone into preterm labor before. ?Have had surgery on their cervix. ?Are younger than age 72  or older than age 108. ?Are African American. ?Are pregnant with twins or multiple babies (multiple gestation). ?Take street drugs or smoke while pregnant. ?Do not gain enough weight while pregnant. ?Became pregnant shortly after having been pregnant. ?What are the symptoms of preterm labor? ?Symptoms of preterm labor include: ?Cramps similar to those that can happen during a menstrual period. The cramps may happen with diarrhea. ?Pain in the abdomen or lower back. ?Regular uterine contractions that may feel like tightening of the abdomen. ?A feeling of increased pressure in the pelvis. ?Increased watery or bloody mucus discharge from the vagina. ?Water breaking (ruptured amniotic sac). ?Why is it important to recognize signs of preterm labor? ?It is important to recognize signs of preterm labor because babies who are born prematurely may not be fully developed. This can put them at an increased risk for: ?Long-term (chronic) heart and lung problems. ?Difficulty immediately after birth with regulating body systems, including blood sugar, body temperature, heart rate, and breathing rate. ?Bleeding in the brain. ?Cerebral palsy. ?Learning difficulties. ?Death. ?These risks are highest for babies who are born before 11 weeks  of pregnancy. ?How is preterm labor treated? ?Treatment depends on the length of your pregnancy, your condition, and the health of your baby. It may involve: ?Having a stitch (suture) placed in your cervix to prevent your cervix from opening too early (cerclage). ?Taking or being given medicines, such as: ?Hormone medicines. These may be given early in pregnancy to help support the pregnancy. ?Medicine to stop contractions. ?Medicines to help mature the baby?s lungs. These may be prescribed if the risk of delivery is high. ?Medicines to prevent your baby from developing cerebral palsy. ?If the labor happens before 34 weeks of pregnancy, you may need to stay in the hospital. ?What should I do if I  think I am in preterm labor? ?If you think that you are going into preterm labor, call your health care provider right away. ?How can I prevent preterm labor in future pregnancies? ?To increase your chance

## 2022-02-23 ENCOUNTER — Encounter: Payer: Medicaid Other | Admitting: Obstetrics and Gynecology

## 2022-03-04 ENCOUNTER — Ambulatory Visit (INDEPENDENT_AMBULATORY_CARE_PROVIDER_SITE_OTHER): Payer: Medicaid Other | Admitting: Obstetrics & Gynecology

## 2022-03-04 ENCOUNTER — Encounter: Payer: Self-pay | Admitting: Obstetrics & Gynecology

## 2022-03-04 VITALS — BP 99/62 | HR 91 | Wt 134.4 lb

## 2022-03-04 DIAGNOSIS — Z3480 Encounter for supervision of other normal pregnancy, unspecified trimester: Secondary | ICD-10-CM

## 2022-03-04 NOTE — Progress Notes (Signed)
? ?  LOW-RISK PREGNANCY VISIT ?Patient name: Elizabeth Bird MRN [id number]  Date of birth: 03-22-1996 ?Chief Complaint:   ?Routine Prenatal Visit (Want cervix check pressure) ? ?History of Present Illness:   ?Elizabeth Bird is a 26 y.o. G27P1011 female at 37w0dwith an Estimated Date of Delivery: 04/22/22 being seen today for ongoing management of a low-risk pregnancy.  ? ?-h/o PTD- on vaginal progesterone ? ?Yesterday noted diarrhea and nausea.  Notes some irregular contractions and for the past 3 days and notes vaginal pressure. She is concerned about preterm labor ? ? Contractions: Irritability.  .  Movement: Present. denies leaking of fluid. ? ? ?  01/26/2022  ? 10:23 AM 10/07/2021  ?  2:24 PM 12/20/2017  ?  2:48 PM 12/04/2017  ? 11:41 AM  ?Depression screen PHQ 2/9  ?Decreased Interest 0 2 0 0  ?Down, Depressed, Hopeless 0 0 0 0  ?PHQ - 2 Score 0 2 0 0  ?Altered sleeping 0 1 1   ?Tired, decreased energy '1 1 1   '$ ?Change in appetite 0 1 0   ?Feeling bad or failure about yourself  0 0 0   ?Trouble concentrating 0 0 0   ?Moving slowly or fidgety/restless 0 0 0   ?Suicidal thoughts 0 0 0   ?PHQ-9 Score '1 5 2   '$ ? ? ?Review of Systems:   ?Pertinent items are noted in HPI ?Denies abnormal vaginal discharge w/ itching/odor/irritation, headaches, visual changes, shortness of breath, chest pain, abdominal pain, severe nausea/vomiting, or problems with urination or bowel movements unless otherwise stated above. ?Pertinent History Reviewed:  ?Reviewed past medical,surgical, social, obstetrical and family history.  ?Reviewed problem list, medications and allergies. ? ?Physical Assessment:  ? ?Vitals:  ? 03/04/22 1123  ?BP: 99/62  ?Pulse: 91  ?Weight: 134 lb 6.4 oz (61 kg)  ?Body mass index is 23.07 kg/m?. ?  ?     Physical Examination:  ? General appearance: Well appearing, and in no distress ? Mental status: Alert, oriented to person, place, and time ? Skin: Warm & dry ? Respiratory: Normal respiratory effort, no distress ? Abdomen:  Soft, gravid, nontender ? Pelvic: Cervical exam performed  Dilation: 1.5 Effacement (%): 50 Station: -2 ? Extremities: Edema: Trace ? Psych:  mood and affect appropriate ? ?Fetal Status: Fetal Heart Rate (bpm): 145 Fundal Height: 33 cm Movement: Present Presentation: Vertex ? ?Chaperone:  AGlenard HaringNeas    ? ?No results found for this or any previous visit (from the past 24 hour(s)).  ? ?Assessment & Plan:  ?1) Low-risk pregnancy G4P1011 at 362w0dith an Estimated Date of Delivery: 04/22/22  ? ?2) Preterm contractions ?-cervix remains unchanged ?-s/p BMZ 3/3-4 ?-continue vaginal progesterone ?-reviewed precautions ?  ?Meds: No orders of the defined types were placed in this encounter. ? ?Labs/procedures today: none ? ?Plan:  Continue routine obstetrical care  ?Next visit: prefers in person   ? ?Reviewed: Preterm labor symptoms and general obstetric precautions including but not limited to vaginal bleeding, contractions, leaking of fluid and fetal movement were reviewed in detail with the patient.  All questions were answered.  ? ?Follow-up: Return in about 2 weeks (around 03/18/2022) for [place]. ? ?No orders of the defined types were placed in this encounter. ? ? ?JeJanyth PupaDO ?Attending [facility] ?Center for [place] ? ? ?

## 2022-03-18 ENCOUNTER — Encounter: Payer: Medicaid Other | Admitting: Obstetrics & Gynecology

## 2022-03-31 ENCOUNTER — Encounter: Payer: Medicaid Other | Admitting: Obstetrics & Gynecology

## 2022-04-01 ENCOUNTER — Encounter: Payer: Self-pay | Admitting: Obstetrics & Gynecology

## 2022-04-01 ENCOUNTER — Other Ambulatory Visit (HOSPITAL_COMMUNITY)
Admission: RE | Admit: 2022-04-01 | Discharge: 2022-04-01 | Disposition: A | Discharge status: Home / Self Care | Payer: Medicaid Other | Source: Ambulatory Visit | Attending: Obstetrics & Gynecology | Admitting: Obstetrics & Gynecology

## 2022-04-01 ENCOUNTER — Ambulatory Visit (INDEPENDENT_AMBULATORY_CARE_PROVIDER_SITE_OTHER): Payer: Medicaid Other | Admitting: Obstetrics & Gynecology

## 2022-04-01 VITALS — BP 112/70 | HR 80 | Wt 144.0 lb

## 2022-04-01 DIAGNOSIS — Z3480 Encounter for supervision of other normal pregnancy, unspecified trimester: Secondary | ICD-10-CM

## 2022-04-01 DIAGNOSIS — Z3A37 37 weeks gestation of pregnancy: Secondary | ICD-10-CM | POA: Insufficient documentation

## 2022-04-01 DIAGNOSIS — K645 Perianal venous thrombosis: Secondary | ICD-10-CM | POA: Diagnosis not present

## 2022-04-01 NOTE — Progress Notes (Signed)
   LOW-RISK PREGNANCY VISIT Patient name: Elizabeth Bird MRN [id number]  Date of birth: 11-Apr-1996 Chief Complaint:   Routine Prenatal Visit (cultures)  History of Present Illness:   Farran Amsden is a 26 y.o. G63P1011 female at 39w0dwith an Estimated Date of Delivery: 04/22/22 being seen today for ongoing management of a low-risk pregnancy.     01/26/2022   10:23 AM 10/07/2021    2:24 PM 12/20/2017    2:48 PM 12/04/2017   11:41 AM  Depression screen PHQ 2/9  Decreased Interest 0 2 0 0  Down, Depressed, Hopeless 0 0 0 0  PHQ - 2 Score 0 2 0 0  Altered sleeping 0 1 1   Tired, decreased energy '1 1 1   '$ Change in appetite 0 1 0   Feeling bad or failure about yourself  0 0 0   Trouble concentrating 0 0 0   Moving slowly or fidgety/restless 0 0 0   Suicidal thoughts 0 0 0   PHQ-9 Score '1 5 2     '$ Today she reports painful hemorrhoid. Contractions: Irregular. Vag. Bleeding: None.  Movement: Present. denies leaking of fluid. Review of Systems:   Pertinent items are noted in HPI Denies abnormal vaginal discharge w/ itching/odor/irritation, headaches, visual changes, shortness of breath, chest pain, abdominal pain, severe nausea/vomiting, or problems with urination or bowel movements unless otherwise stated above. Pertinent History Reviewed:  Reviewed past medical,surgical, social, obstetrical and family history.  Reviewed problem list, medications and allergies. Physical Assessment:   Vitals:   04/01/22 1211  BP: 112/70  Pulse: 80  Weight: 144 lb (65.3 kg)  Body mass index is 24.72 kg/m.        Physical Examination:   General appearance: Well appearing, and in no distress  Mental status: Alert, oriented to person, place, and time  Skin: Warm & dry  Cardiovascular: Normal heart rate noted  Respiratory: Normal respiratory effort, no distress  Abdomen: Soft, gravid, nontender  Pelvic: Cervical exam deferred         Extremities: Edema: None  Fetal Status:     Movement: Present     Chaperone: Latisha Cresenzo    No results found for this or any previous visit (from the past 24 hour(s)).  Assessment & Plan:  1) Low-risk pregnancy G4P1011 at 370w0dith an Estimated Date of Delivery: 04/22/22     ICD-10-CM   1. Supervision of other normal pregnancy, antepartum  Z34.80 Cervicovaginal ancillary only    Culture, beta strep (group b only)    2. [redacted] weeks gestation of pregnancy  Z3A.37 Cervicovaginal ancillary only    Culture, beta strep (group b only)    3. External thrombosed hemorrhoid  K64.5    Incised and removed        Meds: No orders of the defined types were placed in this encounter.  Labs/procedures today: Incision drainage thrombosed hemorrhoid  Plan:  Continue routine obstetrical care  Next visit: prefers in person    Reviewed: Term labor symptoms and general obstetric precautions including but not limited to vaginal bleeding, contractions, leaking of fluid and fetal movement were reviewed in detail with the patient.  All questions were answered. Has home bp cuff. Rx faxed to . Check bp weekly, let usKoreanow if >140/90.   Follow-up: No follow-ups on file.  Orders Placed This Encounter  Procedures   Culture, beta strep (group b only)    LuFlorian BuffMD 04/01/2022 12:44 PM

## 2022-04-04 LAB — CERVICOVAGINAL ANCILLARY ONLY
Chlamydia: NEGATIVE
Comment: NEGATIVE
Comment: NORMAL
Neisseria Gonorrhea: NEGATIVE

## 2022-04-05 LAB — CULTURE, BETA STREP (GROUP B ONLY): Strep Gp B Culture: NEGATIVE

## 2022-04-06 ENCOUNTER — Encounter (HOSPITAL_COMMUNITY): Payer: Self-pay | Admitting: Obstetrics & Gynecology

## 2022-04-06 ENCOUNTER — Inpatient Hospital Stay (HOSPITAL_COMMUNITY): Payer: Medicaid Other | Admitting: Anesthesiology

## 2022-04-06 ENCOUNTER — Inpatient Hospital Stay (HOSPITAL_COMMUNITY)
Admission: AD | Admit: 2022-04-06 | Discharge: 2022-04-08 | DRG: 797 | Disposition: A | Discharge status: Home / Self Care | Payer: Medicaid Other | Attending: Obstetrics & Gynecology | Admitting: Obstetrics & Gynecology

## 2022-04-06 ENCOUNTER — Other Ambulatory Visit: Payer: Self-pay

## 2022-04-06 DIAGNOSIS — Z302 Encounter for sterilization: Secondary | ICD-10-CM | POA: Diagnosis not present

## 2022-04-06 DIAGNOSIS — O26893 Other specified pregnancy related conditions, third trimester: Principal | ICD-10-CM | POA: Diagnosis present

## 2022-04-06 DIAGNOSIS — Z87891 Personal history of nicotine dependence: Secondary | ICD-10-CM | POA: Diagnosis not present

## 2022-04-06 DIAGNOSIS — O9081 Anemia of the puerperium: Secondary | ICD-10-CM | POA: Diagnosis not present

## 2022-04-06 DIAGNOSIS — Z9851 Tubal ligation status: Secondary | ICD-10-CM

## 2022-04-06 DIAGNOSIS — D62 Acute posthemorrhagic anemia: Secondary | ICD-10-CM | POA: Diagnosis not present

## 2022-04-06 DIAGNOSIS — Z3A37 37 weeks gestation of pregnancy: Secondary | ICD-10-CM | POA: Diagnosis not present

## 2022-04-06 DIAGNOSIS — Z3490 Encounter for supervision of normal pregnancy, unspecified, unspecified trimester: Secondary | ICD-10-CM

## 2022-04-06 DIAGNOSIS — Z8759 Personal history of other complications of pregnancy, childbirth and the puerperium: Secondary | ICD-10-CM

## 2022-04-06 DIAGNOSIS — D500 Iron deficiency anemia secondary to blood loss (chronic): Secondary | ICD-10-CM

## 2022-04-06 LAB — CBC
HCT: 31 % — ABNORMAL LOW (ref 36.0–46.0)
Hemoglobin: 10.2 g/dL — ABNORMAL LOW (ref 12.0–15.0)
MCH: 29 pg (ref 26.0–34.0)
MCHC: 32.9 g/dL (ref 30.0–36.0)
MCV: 88.1 fL (ref 80.0–100.0)
Platelets: 301 10*3/uL (ref 150–400)
RBC: 3.52 MIL/uL — ABNORMAL LOW (ref 3.87–5.11)
RDW: 13.7 % (ref 11.5–15.5)
WBC: 12.2 10*3/uL — ABNORMAL HIGH (ref 4.0–10.5)
nRBC: 0 % (ref 0.0–0.2)

## 2022-04-06 LAB — TYPE AND SCREEN
ABO/RH(D): A POS
Antibody Screen: NEGATIVE

## 2022-04-06 MED ORDER — FENTANYL CITRATE (PF) 100 MCG/2ML IJ SOLN: 50.0000 ug | INTRAMUSCULAR | Status: DC | PRN

## 2022-04-06 MED ORDER — ONDANSETRON HCL 4 MG/2ML IJ SOLN
4.0000 mg | Freq: Four times a day (QID) | INTRAMUSCULAR | Status: DC | PRN
  Administered 2022-04-07: 4 mg via INTRAVENOUS
  Filled 2022-04-06: qty 2

## 2022-04-06 MED ORDER — EPHEDRINE 5 MG/ML INJ
10.0000 mg | INTRAVENOUS | Status: DC | PRN
Start: 2022-04-06 — End: 2022-04-07

## 2022-04-06 MED ORDER — FENTANYL-BUPIVACAINE-NACL 0.5-0.125-0.9 MG/250ML-% EP SOLN
12.0000 mL/h | EPIDURAL | Status: DC | PRN
  Administered 2022-04-06: 12 mL/h via EPIDURAL
  Filled 2022-04-06: qty 250

## 2022-04-06 MED ORDER — DIPHENHYDRAMINE HCL 50 MG/ML IJ SOLN: 12.5000 mg | INTRAMUSCULAR | Status: DC | PRN

## 2022-04-06 MED ORDER — LACTATED RINGERS IV SOLN: 500.0000 mL | INTRAVENOUS | Status: DC | PRN

## 2022-04-06 MED ORDER — PHENYLEPHRINE 80 MCG/ML (10ML) SYRINGE FOR IV PUSH (FOR BLOOD PRESSURE SUPPORT): 80.0000 ug | PREFILLED_SYRINGE | INTRAVENOUS | Status: DC | PRN

## 2022-04-06 MED ORDER — LACTATED RINGERS IV SOLN: INTRAVENOUS | Status: DC

## 2022-04-06 MED ORDER — OXYTOCIN BOLUS FROM INFUSION
333.0000 mL | Freq: Once | INTRAVENOUS | Status: AC
  Administered 2022-04-07: 333 mL via INTRAVENOUS

## 2022-04-06 MED ORDER — OXYCODONE-ACETAMINOPHEN 5-325 MG PO TABS: 1.0000 | ORAL_TABLET | ORAL | Status: DC | PRN

## 2022-04-06 MED ORDER — LACTATED RINGERS IV SOLN
500.0000 mL | Freq: Once | INTRAVENOUS | Status: AC
  Administered 2022-04-06: 500 mL via INTRAVENOUS

## 2022-04-06 MED ORDER — ACETAMINOPHEN 325 MG PO TABS: 650.0000 mg | ORAL_TABLET | ORAL | Status: DC | PRN

## 2022-04-06 MED ORDER — OXYTOCIN-SODIUM CHLORIDE 30-0.9 UT/500ML-% IV SOLN
2.5000 [IU]/h | INTRAVENOUS | Status: DC
  Administered 2022-04-07: 2.5 [IU]/h via INTRAVENOUS
  Filled 2022-04-06: qty 500

## 2022-04-06 MED ORDER — SOD CITRATE-CITRIC ACID 500-334 MG/5ML PO SOLN: 30.0000 mL | ORAL | Status: DC | PRN

## 2022-04-06 MED ORDER — OXYCODONE-ACETAMINOPHEN 5-325 MG PO TABS: 2.0000 | ORAL_TABLET | ORAL | Status: DC | PRN

## 2022-04-06 MED ORDER — EPHEDRINE 5 MG/ML INJ: 10.0000 mg | INTRAVENOUS | Status: DC | PRN

## 2022-04-06 MED ORDER — LIDOCAINE HCL (PF) 1 % IJ SOLN: 30.0000 mL | INTRAMUSCULAR | Status: DC | PRN

## 2022-04-06 NOTE — MAU Note (Signed)
.  Elizabeth Bird is a 26 y.o. at 71w5dhere in MAU reporting: strong regular contractions since 7 pm, reports positive fetal movement.  Onset of complaint: 1900 Pain score: 5/10 There were no vitals filed for this visit.   Lab orders placed from triage:

## 2022-04-06 NOTE — Anesthesia Preprocedure Evaluation (Signed)
Anesthesia Evaluation  Patient identified by MRN, date of birth, ID band Patient awake    Reviewed: Allergy & Precautions, NPO status , Patient's Chart, lab work & pertinent test results  Airway Mallampati: I  TM Distance: >3 FB Neck ROM: Full    Dental no notable dental hx.    Pulmonary neg pulmonary ROS, former smoker,    Pulmonary exam normal breath sounds clear to auscultation       Cardiovascular negative cardio ROS Normal cardiovascular exam Rhythm:Regular Rate:Normal     Neuro/Psych negative neurological ROS  negative psych ROS   GI/Hepatic negative GI ROS, Neg liver ROS,   Endo/Other  negative endocrine ROS  Renal/GU negative Renal ROS  negative genitourinary   Musculoskeletal negative musculoskeletal ROS (+)   Abdominal   Peds negative pediatric ROS (+)  Hematology negative hematology ROS (+)   Anesthesia Other Findings   Reproductive/Obstetrics (+) Pregnancy                             Anesthesia Physical Anesthesia Plan  ASA: 2  Anesthesia Plan: Epidural   Post-op Pain Management:    Induction:   PONV Risk Score and Plan: 2 and Treatment may vary due to age or medical condition  Airway Management Planned: Natural Airway  Additional Equipment: None  Intra-op Plan:   Post-operative Plan:   Informed Consent: I have reviewed the patients History and Physical, chart, labs and discussed the procedure including the risks, benefits and alternatives for the proposed anesthesia with the patient or authorized representative who has indicated his/her understanding and acceptance.       Plan Discussed with: Anesthesiologist  Anesthesia Plan Comments:         Anesthesia Quick Evaluation

## 2022-04-06 NOTE — H&P (Signed)
Elizabeth Bird is a 26 y.o. G24P1011 female at 25w5dby 8wk u/s presenting for reg ctx.   Reports active fetal movement, contractions: regular, every 2-3 minutes, vaginal bleeding: scant staining, membranes: intact.  Initiated prenatal care at [facility] at 11.6 wks.   Most recent u/s : 264w2deph, post placenta, nl fluid.   This pregnancy complicated by: # shortened cx with vag progesterone # wants ppBTL (salpingectomy)- signed papers 01/26/22  Prenatal History/Complications:  # hx PPROM w PTD @ 16wks  Past Medical History: Past Medical History:  Diagnosis Date   Medical history non-contributory     Past Surgical History: Past Surgical History:  Procedure Laterality Date   NO PAST SURGERIES      Obstetrical History: OB History     Gravida  4   Para  2   Term  1   Preterm      AB  1   Living  1      SAB  1   IAB      Ectopic      Multiple  0   Live Births  1           Social History: Social History   Socioeconomic History   Marital status: Single    Spouse name: Not on file   Number of children: Not on file   Years of education: Not on file   Highest education level: Not on file  Occupational History   Not on file  Tobacco Use   Smoking status: Former    Years: 7.00    Types: Cigarettes   Smokeless tobacco: Never  Vaping Use   Vaping Use: Never used  Substance and Sexual Activity   Alcohol use: Not Currently    Comment: occasionally; not now   Drug use: No    Types: Marijuana    Comment: LAST use was Sept 2016   Sexual activity: Yes    Birth control/protection: None  Other Topics Concern   Not on file  Social History Narrative   Not on file   Social Determinants of Health   Financial Resource Strain: Low Risk    Difficulty of Paying Living Expenses: Not hard at all  Food Insecurity: No Food Insecurity   Worried About RuCharity fundraisern the Last Year: Never true   [place] the Last Year: Never true  Transportation  Needs: No Transportation Needs   Lack of Transportation (Medical): No   Lack of Transportation (Non-Medical): No  Physical Activity: Insufficiently Active   Days of Exercise per Week: 6 days   Minutes of Exercise per Session: 20 min  Stress: No Stress Concern Present   Feeling of Stress : Only a little  Social Connections: Moderately Isolated   Frequency of Communication with Friends and Family: Three times a week   Frequency of Social Gatherings with Friends and Family: Twice a week   Attends Religious Services: Never   AcMarine scientistr Organizations: No   Attends ClMusic therapistNever   Marital Status: Living with partner    Family History: Family History  Problem Relation Age of Onset   Thyroid disease Sister    Schizophrenia Sister    Bipolar disorder Sister    Post-traumatic stress disorder Sister    Asthma Maternal Aunt    Cancer Maternal Aunt        colon   Multiple sclerosis Maternal Grandmother    Arthritis Maternal Grandmother  Suicidality Maternal Grandfather     Allergies: Allergies  Allergen Reactions   Peroxide [Hydrogen Peroxide] Hives    Medications Prior to Admission  Medication Sig Dispense Refill Last Dose   Blood Pressure Monitor MISC For regular home bp monitoring during pregnancy 1 each 0    NIFEdipine (PROCARDIA) 10 MG capsule Take 1 capsule (10 mg total) by mouth 3 (three) times daily. (Patient not taking: Reported on 04/01/2022) 90 capsule 3    Prenatal 6.75-0.2 MG TABS Take 1 tablet by mouth daily. 90 tablet 6    progesterone (PROMETRIUM) 200 MG capsule Place one capsule vaginally at bedtime (Patient not taking: Reported on 04/01/2022) 30 capsule 3     Review of Systems  Pertinent pos/neg as indicated in HPI  Blood pressure 126/86, pulse 96, temperature 98.4 F (36.9 C), resp. rate 18, SpO2 100 %, currently breastfeeding. General appearance: alert, cooperative, and mild distress Lungs: clear to auscultation  bilaterally Heart: regular rate and rhythm Abdomen: gravid, soft, non-tender, EFW by Leopold's approximately 7lbs Extremities: 1+ edema  Fetal monitoring: FHR: 140s bpm, variability: moderate,  Accelerations: Present,  decelerations:  Absent Uterine activity: Frequency: Every 2-3 minutes Dilation: 5 Effacement (%): 90 Station: -2 Exam by:: weston,rn Presentation: cephalic   Prenatal labs: ABO, Rh: A/Positive/-- (12/08 1512) Antibody: Negative (03/29 0859) Rubella: 3.22 (12/08 1512) RPR: Non Reactive (03/29 0859)  HBsAg: Negative (12/08 1512)  HIV: Non Reactive (03/29 0859)  GBS: Negative/-- (06/02 1330)  2hr GTT: 77/100/93  Prenatal Transfer Tool  Maternal Diabetes: No Genetic Screening: Normal Maternal Ultrasounds/Referrals: Normal Fetal Ultrasounds or other Referrals:  None Maternal Substance Abuse:  No Significant Maternal Medications:  None Significant Maternal Lab Results: Group B Strep negative  No results found for this or any previous visit (from the past 24 hour(s)).   Assessment:  48w5dSIUP  G4P1011  Latent labor  Cat 1 FHR  GBS Negative/-- (06/02 1330)  Plan:  Admit to L&D  IV pain meds/epidural prn active labor  Expectant management  Anticipate vag del   Plans to breastfeed  Contraception: salpingectomy  Circumcision: yes  KMyrtis SerCNM 04/06/2022, 10:44 PM

## 2022-04-07 ENCOUNTER — Encounter (HOSPITAL_COMMUNITY): Payer: Self-pay | Admitting: Obstetrics & Gynecology

## 2022-04-07 DIAGNOSIS — Z3A37 37 weeks gestation of pregnancy: Secondary | ICD-10-CM | POA: Diagnosis not present

## 2022-04-07 DIAGNOSIS — Z302 Encounter for sterilization: Secondary | ICD-10-CM

## 2022-04-07 LAB — RPR: RPR Ser Ql: NONREACTIVE

## 2022-04-07 MED ORDER — SIMETHICONE 80 MG PO CHEW: 80.0000 mg | CHEWABLE_TABLET | ORAL | Status: DC | PRN

## 2022-04-07 MED ORDER — MEASLES, MUMPS & RUBELLA VAC IJ SOLR: 0.5000 mL | Freq: Once | INTRAMUSCULAR | Status: DC

## 2022-04-07 MED ORDER — WITCH HAZEL-GLYCERIN EX PADS: 1.0000 "application " | MEDICATED_PAD | CUTANEOUS | Status: DC | PRN

## 2022-04-07 MED ORDER — LIDOCAINE HCL (PF) 1 % IJ SOLN
INTRAMUSCULAR | Status: DC | PRN
  Administered 2022-04-06: 8 mL via EPIDURAL

## 2022-04-07 MED ORDER — ONDANSETRON HCL 4 MG/2ML IJ SOLN: 4.0000 mg | INTRAMUSCULAR | Status: DC | PRN

## 2022-04-07 MED ORDER — OXYCODONE HCL 5 MG PO TABS: 5.0000 mg | ORAL_TABLET | ORAL | Status: DC | PRN

## 2022-04-07 MED ORDER — DIPHENHYDRAMINE HCL 25 MG PO CAPS: 25.0000 mg | ORAL_CAPSULE | Freq: Four times a day (QID) | ORAL | Status: DC | PRN

## 2022-04-07 MED ORDER — DIBUCAINE (PERIANAL) 1 % EX OINT: 1.0000 "application " | TOPICAL_OINTMENT | CUTANEOUS | Status: DC | PRN

## 2022-04-07 MED ORDER — BENZOCAINE-MENTHOL 20-0.5 % EX AERO: 1.0000 "application " | INHALATION_SPRAY | CUTANEOUS | Status: DC | PRN

## 2022-04-07 MED ORDER — TETANUS-DIPHTH-ACELL PERTUSSIS 5-2.5-18.5 LF-MCG/0.5 IM SUSY: 0.5000 mL | PREFILLED_SYRINGE | Freq: Once | INTRAMUSCULAR | Status: DC

## 2022-04-07 MED ORDER — ACETAMINOPHEN 325 MG PO TABS: 650.0000 mg | ORAL_TABLET | ORAL | Status: DC | PRN

## 2022-04-07 MED ORDER — COCONUT OIL OIL: 1.0000 "application " | TOPICAL_OIL | Status: DC | PRN

## 2022-04-07 MED ORDER — IBUPROFEN 600 MG PO TABS
600.0000 mg | ORAL_TABLET | Freq: Four times a day (QID) | ORAL | Status: DC
  Administered 2022-04-07 – 2022-04-08 (×5): 600 mg via ORAL
  Filled 2022-04-07 (×5): qty 1

## 2022-04-07 MED ORDER — PRENATAL MULTIVITAMIN CH
1.0000 | ORAL_TABLET | Freq: Every day | ORAL | Status: DC
  Administered 2022-04-07: 1 via ORAL
  Filled 2022-04-07: qty 1

## 2022-04-07 MED ORDER — ZOLPIDEM TARTRATE 5 MG PO TABS: 5.0000 mg | ORAL_TABLET | Freq: Every evening | ORAL | Status: DC | PRN

## 2022-04-07 MED ORDER — ONDANSETRON HCL 4 MG PO TABS: 4.0000 mg | ORAL_TABLET | ORAL | Status: DC | PRN

## 2022-04-07 MED ORDER — SENNOSIDES-DOCUSATE SODIUM 8.6-50 MG PO TABS
2.0000 | ORAL_TABLET | ORAL | Status: DC
  Filled 2022-04-07: qty 2

## 2022-04-07 NOTE — Discharge Summary (Signed)
Postpartum Discharge Summary      Patient Name: Elizabeth Bird DOB: Jan 20, 1996 MRN: [id number]  Date of admission: 04/06/2022 Delivery date:04/07/2022  Delivering provider: Serita Grammes D  Date of discharge: 04/08/2022  Admitting diagnosis: Indication for care in labor and delivery, antepartum [O75.9] Intrauterine pregnancy: [redacted]w[redacted]d    Secondary diagnosis:  Active Problems:   Labor and delivery, indication for care   Encounter for supervision of normal pregnancy, antepartum   Blood loss anemia   S/P tubal ligation  Additional problems: none    Discharge diagnosis: Term Pregnancy Delivered                                              Post partum procedures:postpartum tubal ligation Augmentation:  none Complications: none  Hospital course: Onset of Labor With Vaginal Delivery      26y.o. yo G4P1011 at 343w6das admitted in Latent Labor on 04/06/2022. Patient had an uncomplicated labor course as follows:  Membrane Rupture Time/Date: 4:24 AM ,04/07/2022   Delivery Method:Vaginal, Spontaneous  Episiotomy: None  Lacerations:  None  Patient had a postpartum course remarkable for having a ppBTL on PPD#1 which she tolerated well. Postpartum day 1 hgb 7.9, asymptomatic. IV iron offered, patient declined, instead opted for oral iron which was prescribed at discharge.  She is ambulating, tolerating a regular diet, passing flatus, and urinating well. Patient is discharged home in stable condition on 04/08/22.  Newborn Data: Birth date:04/07/2022  Birth time:5:03 AM  Gender:Female  Living status:Living  Apgars:9 ,9  Weight:3380 g (7lb 7.2oz)  Magnesium Sulfate received: No BMZ received: No Rhophylac:N/A MMR:N/A T-DaP:Given prenatally Flu: No Transfusion:No  Physical exam  Vitals:   04/07/22 2100 04/08/22 0636 04/08/22 0820 04/08/22 0910  BP: 110/67 104/71  118/87  Pulse: (!) 58 71 64 63  Resp: _0 Temp: 98.5 F (36.9 C) 98.3 F (36.8 C) 98.2 F (36.8 C) 98 F (36.7 C)   TempSrc: Oral Oral Oral Oral  SpO2: 99% 99%    Weight:      Height:       General: alert, cooperative, and no distress Lochia: appropriate Uterine Fundus: firm Incision: Dressing is clean, dry, and intact DVT Evaluation: No cords or calf tenderness. Labs: Lab Results  Component Value Date   WBC 12.2 (H) 04/08/2022   HGB 7.9 (L) 04/08/2022   HCT 24.2 (L) 04/08/2022   MCV 88.3 04/08/2022   PLT 225 04/08/2022      Latest Ref Rng & Units 04/24/2017   12:44 PM  CMP  Glucose 65 - 99 mg/dL 92   BUN 6 - 20 mg/dL 15   Creatinine 0.44 - 1.00 mg/dL 0.71   Sodium 135 - 145 mmol/L 138   Potassium 3.5 - 5.1 mmol/L 4.1   Chloride 101 - 111 mmol/L 105   CO2 22 - 32 mmol/L 27   Calcium 8.9 - 10.3 mg/dL 8.9    Edinburgh Score:    04/07/2022    9:01 PM  Edinburgh Postnatal Depression Scale Screening Tool  I have been able to laugh and see the funny side of things. 0  I have looked forward with enjoyment to things. 0  I have blamed myself unnecessarily when things went wrong. 1  I have been anxious or worried for no good reason. 0  I have felt scared or  panicky for no good reason. 0  Things have been getting on top of me. 0  I have been so unhappy that I have had difficulty sleeping. 0  I have felt sad or miserable. 0  I have been so unhappy that I have been crying. 0  The thought of harming myself has occurred to me. 0  Edinburgh Postnatal Depression Scale Total 1     After visit meds:  Allergies as of 04/08/2022       Reactions   Peroxide [hydrogen Peroxide] Hives, Rash        Medication List     STOP taking these medications    Blood Pressure Monitor Misc   NIFEdipine 10 MG capsule Commonly known as: Procardia   OVER THE COUNTER MEDICATION   progesterone 200 MG capsule Commonly known as: PROMETRIUM   TUMS PO       TAKE these medications    acetaminophen 325 MG tablet Commonly known as: Tylenol Take 2 tablets (650 mg total) by mouth every 4 (four) hours  as needed.   ferrous sulfate 325 (65 FE) MG tablet Commonly known as: FerrouSul Take 1 tablet (325 mg total) by mouth every other day.   ibuprofen 600 MG tablet Commonly known as: ADVIL Take 1 tablet (600 mg total) by mouth every 6 (six) hours as needed.   Prenatal 27-1 MG Tabs Take 1 tablet by mouth daily. What changed:  medication strength how much to take Another medication with the same name was removed. Continue taking this medication, and follow the directions you see here.               Discharge Care Instructions  (From admission, onward)           Start     Ordered   04/08/22 0000  Leave dressing on - Keep it clean, dry, and intact until clinic visit        04/08/22 1049   04/08/22 0000  Discharge wound care:       Comments: Remove dressing in 3 days   04/08/22 1049             Discharge home in stable condition Infant Feeding: Breast Infant Disposition:home with mother Discharge instruction: per After Visit Summary and Postpartum booklet. Activity: Advance as tolerated. Pelvic rest for 6 weeks.  Diet: routine diet Future Appointments: No future appointments.  Follow up Visit:  Follow-up Information     [facility] Follow up.   Why: 4-6 weeks Contact information: [address] [place] [address] [phone]                Myrtis Ser, CNM  [facility] [place] Please schedule this patient for Postpartum visit in: 6 weeks with the following provider: Any provider  In-Person  For C/S patients schedule nurse incision check in weeks 2 weeks: no  Low risk pregnancy complicated by: none  Delivery mode:  SVD  Anticipated Birth Control:  BTL completed  PP Procedures needed: none  Schedule Integrated BH visit: no   04/08/2022 Desma Maxim, MD

## 2022-04-07 NOTE — Anesthesia Procedure Notes (Addendum)
Epidural Patient location during procedure: OB Start time: 04/06/2022 11:50 PM End time: 04/07/2022 12:00 AM  Staffing Anesthesiologist: Merlinda Frederick, MD Performed: anesthesiologist   Preanesthetic Checklist Completed: patient identified, IV checked, site marked, risks and benefits discussed, monitors and equipment checked, pre-op evaluation and timeout performed  Epidural Patient position: sitting Prep: DuraPrep Patient monitoring: heart rate, cardiac monitor, continuous pulse ox and blood pressure Approach: midline Location: L3-L4 Injection technique: LOR saline  Needle:  Needle type: Tuohy  Needle gauge: 17 G Needle length: 9 cm Needle insertion depth: 4.5 cm Catheter type: closed end flexible Catheter size: 20 Guage Catheter at skin depth: 9 cm Test dose: negative and Other  Assessment Events: blood not aspirated, injection not painful, no injection resistance and negative IV test  Additional Notes Informed consent obtained prior to proceeding including risk of failure, 1% risk of PDPH, risk of minor discomfort and bruising.  Discussed rare but serious complications including epidural abscess, permanent nerve injury, epidural hematoma.  Discussed alternatives to epidural analgesia and patient desires to proceed.  Timeout performed pre-procedure verifying patient name, procedure, and platelet count.  Patient tolerated procedure well.

## 2022-04-07 NOTE — Lactation Note (Signed)
This note was copied from a baby's chart. Lactation Consultation Note  Patient Name: Elizabeth Bird VANVB'T Date: 04/07/2022   Age:25 hours P3, ETI female infant. Mom had requested [place] services earlier this evening. Per RN, mom declined [place] and would like to be seen in the morning.  Maternal Data    Feeding    LATCH Score                    Lactation Tools Discussed/Used    Interventions    Discharge    Consult Status      [name] 04/07/2022, 11:04 PM

## 2022-04-07 NOTE — Lactation Note (Signed)
This note was copied from a baby's chart. Lactation Consultation Note  Patient Name: Elizabeth Bird VOUZH'Q Date: 04/07/2022 Reason for consult: Initial assessment Age:25 hours  P2, Ex BF for 2 years.  Baby has had 2 short feedings since birth.  Suggest unwrapping him after mother naps and place him STS to interest him in feeding. Feed on demand with cues.  Goal 8-12+ times per day after first 24 hrs.  Place baby STS if not cueing.  Provided lactation information sheet. Suggest calling for help as needed.  Maternal Data Has patient been taught Hand Expression?: Yes Does the patient have breastfeeding experience prior to this delivery?: Yes How long did the patient breastfeed?: 2 years  Feeding Mother's Current Feeding Choice: Breast Milk   Interventions Interventions: Education;LC Services brochure  Consult Status Consult Status: Follow-up Date: 04/08/22 Follow-up type: In-patient    [name] [facility] 04/07/2022, 12:38 PM

## 2022-04-08 ENCOUNTER — Inpatient Hospital Stay (HOSPITAL_COMMUNITY): Payer: Medicaid Other | Admitting: Certified Registered Nurse Anesthetist

## 2022-04-08 ENCOUNTER — Encounter (HOSPITAL_COMMUNITY): Payer: Self-pay | Admitting: Obstetrics and Gynecology

## 2022-04-08 ENCOUNTER — Encounter: Payer: Medicaid Other | Admitting: Advanced Practice Midwife

## 2022-04-08 ENCOUNTER — Other Ambulatory Visit: Payer: Self-pay

## 2022-04-08 ENCOUNTER — Encounter (HOSPITAL_COMMUNITY)
Admission: AD | Disposition: A | Discharge status: Home / Self Care | Payer: Self-pay | Source: Home / Self Care | Attending: Obstetrics & Gynecology

## 2022-04-08 DIAGNOSIS — Z9851 Tubal ligation status: Secondary | ICD-10-CM

## 2022-04-08 DIAGNOSIS — D500 Iron deficiency anemia secondary to blood loss (chronic): Secondary | ICD-10-CM

## 2022-04-08 DIAGNOSIS — Z302 Encounter for sterilization: Secondary | ICD-10-CM | POA: Diagnosis not present

## 2022-04-08 HISTORY — PX: TUBAL LIGATION: SHX77

## 2022-04-08 LAB — CBC
HCT: 24.2 % — ABNORMAL LOW (ref 36.0–46.0)
Hemoglobin: 7.9 g/dL — ABNORMAL LOW (ref 12.0–15.0)
MCH: 28.8 pg (ref 26.0–34.0)
MCHC: 32.6 g/dL (ref 30.0–36.0)
MCV: 88.3 fL (ref 80.0–100.0)
Platelets: 225 10*3/uL (ref 150–400)
RBC: 2.74 MIL/uL — ABNORMAL LOW (ref 3.87–5.11)
RDW: 14.1 % (ref 11.5–15.5)
WBC: 12.2 10*3/uL — ABNORMAL HIGH (ref 4.0–10.5)
nRBC: 0 % (ref 0.0–0.2)

## 2022-04-08 SURGERY — LIGATION, FALLOPIAN TUBE, POSTPARTUM
Anesthesia: Epidural | Laterality: Bilateral

## 2022-04-08 MED ORDER — OXYCODONE HCL 5 MG PO TABS: 5.0000 mg | ORAL_TABLET | Freq: Once | ORAL | Status: DC | PRN

## 2022-04-08 MED ORDER — PROMETHAZINE HCL 25 MG/ML IJ SOLN: 6.2500 mg | INTRAMUSCULAR | Status: DC | PRN

## 2022-04-08 MED ORDER — METOCLOPRAMIDE HCL 10 MG PO TABS: 10.0000 mg | ORAL_TABLET | Freq: Once | ORAL | Status: DC

## 2022-04-08 MED ORDER — PRENATAL 27-1 MG PO TABS: 1.0000 | ORAL_TABLET | Freq: Every day | ORAL | Status: DC

## 2022-04-08 MED ORDER — LACTATED RINGERS IV SOLN: INTRAVENOUS | Status: DC

## 2022-04-08 MED ORDER — FERROUS SULFATE 325 (65 FE) MG PO TABS: 325.0000 mg | ORAL_TABLET | ORAL | 1 refills | Status: DC

## 2022-04-08 MED ORDER — DEXAMETHASONE SODIUM PHOSPHATE 10 MG/ML IJ SOLN
INTRAMUSCULAR | Status: DC | PRN
  Administered 2022-04-08: 10 mg via INTRAVENOUS

## 2022-04-08 MED ORDER — KETOROLAC TROMETHAMINE 30 MG/ML IJ SOLN
INTRAMUSCULAR | Status: AC
  Filled 2022-04-08: qty 1

## 2022-04-08 MED ORDER — HYDROMORPHONE HCL 1 MG/ML IJ SOLN: 0.2500 mg | INTRAMUSCULAR | Status: DC | PRN

## 2022-04-08 MED ORDER — ONDANSETRON HCL 4 MG/2ML IJ SOLN
INTRAMUSCULAR | Status: DC | PRN
  Administered 2022-04-08: 4 mg via INTRAVENOUS

## 2022-04-08 MED ORDER — BUPIVACAINE HCL (PF) 0.25 % IJ SOLN
INTRAMUSCULAR | Status: AC
  Filled 2022-04-08: qty 30

## 2022-04-08 MED ORDER — SUCCINYLCHOLINE CHLORIDE 200 MG/10ML IV SOSY
PREFILLED_SYRINGE | INTRAVENOUS | Status: DC | PRN
  Administered 2022-04-08: 120 mg via INTRAVENOUS

## 2022-04-08 MED ORDER — OXYCODONE HCL 5 MG PO TABS
ORAL_TABLET | ORAL | Status: AC
  Filled 2022-04-08: qty 1

## 2022-04-08 MED ORDER — ROCURONIUM BROMIDE 100 MG/10ML IV SOLN
INTRAVENOUS | Status: DC | PRN
  Administered 2022-04-08: 5 mg via INTRAVENOUS

## 2022-04-08 MED ORDER — FAMOTIDINE 20 MG PO TABS: 40.0000 mg | ORAL_TABLET | Freq: Once | ORAL | Status: DC

## 2022-04-08 MED ORDER — OXYCODONE HCL 5 MG/5ML PO SOLN: 5.0000 mg | Freq: Once | ORAL | Status: DC | PRN

## 2022-04-08 MED ORDER — FAMOTIDINE 20 MG PO TABS
40.0000 mg | ORAL_TABLET | Freq: Once | ORAL | Status: AC
  Administered 2022-04-08: 40 mg via ORAL
  Filled 2022-04-08: qty 2

## 2022-04-08 MED ORDER — PROPOFOL 10 MG/ML IV BOLUS
INTRAVENOUS | Status: DC | PRN
  Administered 2022-04-08: 140 mg via INTRAVENOUS

## 2022-04-08 MED ORDER — BUPIVACAINE HCL (PF) 0.25 % IJ SOLN
INTRAMUSCULAR | Status: DC | PRN
  Administered 2022-04-08: 10 mL

## 2022-04-08 MED ORDER — FENTANYL CITRATE (PF) 100 MCG/2ML IJ SOLN
INTRAMUSCULAR | Status: AC
  Filled 2022-04-08: qty 2

## 2022-04-08 MED ORDER — IBUPROFEN 600 MG PO TABS: 600.0000 mg | ORAL_TABLET | Freq: Four times a day (QID) | ORAL | 3 refills | Status: DC | PRN

## 2022-04-08 MED ORDER — SODIUM CHLORIDE 0.9 % IR SOLN
Status: DC | PRN
  Administered 2022-04-08: 1

## 2022-04-08 MED ORDER — METOCLOPRAMIDE HCL 10 MG PO TABS
10.0000 mg | ORAL_TABLET | Freq: Once | ORAL | Status: AC
  Administered 2022-04-08: 10 mg via ORAL
  Filled 2022-04-08: qty 1

## 2022-04-08 MED ORDER — KETOROLAC TROMETHAMINE 30 MG/ML IJ SOLN
30.0000 mg | Freq: Once | INTRAMUSCULAR | Status: AC
Start: 2022-04-08 — End: 2022-04-08
  Administered 2022-04-08: 30 mg via INTRAVENOUS

## 2022-04-08 MED ORDER — FENTANYL CITRATE (PF) 100 MCG/2ML IJ SOLN
INTRAMUSCULAR | Status: DC | PRN
  Administered 2022-04-08 (×2): 50 ug via INTRAVENOUS

## 2022-04-08 MED ORDER — ACETAMINOPHEN 325 MG PO TABS: 650.0000 mg | ORAL_TABLET | ORAL | 2 refills | Status: AC | PRN

## 2022-04-08 SURGICAL SUPPLY — 23 items
BLADE SURG 11 STRL SS (BLADE) ×2 IMPLANT
CHLORAPREP W/TINT 26 (MISCELLANEOUS) ×4 IMPLANT
CLIP FILSHIE TUBAL LIGA STRL (Clip) IMPLANT
DRSG OPSITE POSTOP 3X4 (GAUZE/BANDAGES/DRESSINGS) ×2 IMPLANT
DURAPREP 26ML APPLICATOR (WOUND CARE) IMPLANT
GLOVE BIOGEL PI IND STRL 7.0 (GLOVE) ×1 IMPLANT
GLOVE BIOGEL PI IND STRL 7.5 (GLOVE) ×2 IMPLANT
GLOVE BIOGEL PI INDICATOR 7.0 (GLOVE) ×1
GLOVE BIOGEL PI INDICATOR 7.5 (GLOVE) ×2
GLOVE ECLIPSE 7.5 STRL STRAW (GLOVE) ×2 IMPLANT
GOWN STRL REUS W/TWL LRG LVL3 (GOWN DISPOSABLE) ×4 IMPLANT
NEEDLE HYPO 22GX1.5 SAFETY (NEEDLE) ×2 IMPLANT
NS IRRIG 1000ML POUR BTL (IV SOLUTION) ×2 IMPLANT
PACK ABDOMINAL MINOR (CUSTOM PROCEDURE TRAY) ×2 IMPLANT
PROTECTOR NERVE ULNAR (MISCELLANEOUS) ×2 IMPLANT
SPONGE LAP 18X18 RF (DISPOSABLE) ×2 IMPLANT
SPONGE LAP 4X18 RFD (DISPOSABLE) IMPLANT
SUT PLAIN 0 NONE (SUTURE) IMPLANT
SUT VICRYL 0 UR6 27IN ABS (SUTURE) ×2 IMPLANT
SUT VICRYL 4-0 PS2 18IN ABS (SUTURE) ×2 IMPLANT
SYR CONTROL 10ML LL (SYRINGE) ×2 IMPLANT
TOWEL OR 17X24 6PK STRL BLUE (TOWEL DISPOSABLE) ×4 IMPLANT
TRAY FOLEY W/BAG SLVR 14FR (SET/KITS/TRAYS/PACK) ×2 IMPLANT

## 2022-04-08 NOTE — Anesthesia Preprocedure Evaluation (Addendum)
Anesthesia Evaluation  Patient identified by MRN, date of birth, ID band Patient awake    Reviewed: Allergy & Precautions, NPO status , Patient's Chart, lab work & pertinent test results  Airway Mallampati: I  TM Distance: >3 FB Neck ROM: Full    Dental no notable dental hx.    Pulmonary neg pulmonary ROS, former smoker,    Pulmonary exam normal breath sounds clear to auscultation       Cardiovascular negative cardio ROS Normal cardiovascular exam Rhythm:Regular Rate:Normal     Neuro/Psych negative neurological ROS  negative psych ROS   GI/Hepatic negative GI ROS, Neg liver ROS,   Endo/Other  negative endocrine ROS  Renal/GU negative Renal ROS  negative genitourinary   Musculoskeletal negative musculoskeletal ROS (+)   Abdominal   Peds negative pediatric ROS (+)  Hematology negative hematology ROS (+)   Anesthesia Other Findings   Reproductive/Obstetrics                             Anesthesia Physical  Anesthesia Plan  ASA: 2  Anesthesia Plan: General   Post-op Pain Management: Dilaudid IV and Tylenol PO (pre-op)*   Induction: Intravenous, Rapid sequence and Cricoid pressure planned  PONV Risk Score and Plan: 3 and Treatment may vary due to age or medical condition, Ondansetron, Midazolam and Dexamethasone  Airway Management Planned: Oral ETT  Additional Equipment: None  Intra-op Plan:   Post-operative Plan: Extubation in OR  Informed Consent: I have reviewed the patients History and Physical, chart, labs and discussed the procedure including the risks, benefits and alternatives for the proposed anesthesia with the patient or authorized representative who has indicated his/her understanding and acceptance.     Dental advisory given  Plan Discussed with: Anesthesiologist  Anesthesia Plan Comments:        Anesthesia Quick Evaluation

## 2022-04-08 NOTE — Anesthesia Postprocedure Evaluation (Signed)
Anesthesia Post Note  Patient: Elizabeth Bird  Procedure(s) Performed: AN AD [place]     Patient location during evaluation: Mother Baby Anesthesia Type: Epidural Level of consciousness: awake Pain management: satisfactory to patient Vital Signs Assessment: post-procedure vital signs reviewed and stable Respiratory status: spontaneous breathing Cardiovascular status: stable Anesthetic complications: no   No notable events documented.  Last Vitals:  Vitals:   04/08/22 1144 04/08/22 1145  BP:    Pulse: 62 61  Resp: 14 14  Temp:  (!) 36.4 C  SpO2: 96% 96%    Last Pain:  Vitals:   04/08/22 1145  TempSrc: Oral  PainSc: 1    Pain Goal:                   [employer]

## 2022-04-08 NOTE — Anesthesia Postprocedure Evaluation (Signed)
Anesthesia Post Note  Patient: Elizabeth Bird  Procedure(s) Performed: POST PARTUM TUBAL LIGATION (Bilateral)     Patient location during evaluation: PACU Anesthesia Type: General Level of consciousness: awake and alert Pain management: pain level controlled Vital Signs Assessment: post-procedure vital signs reviewed and stable Respiratory status: spontaneous breathing, nonlabored ventilation and respiratory function stable Cardiovascular status: blood pressure returned to baseline and stable Postop Assessment: no apparent nausea or vomiting Anesthetic complications: no   No notable events documented.  Last Vitals:  Vitals:   04/08/22 1211 04/08/22 1212  BP:    Pulse: (!) 57 60  Resp: 13 14  Temp:    SpO2: 96% 96%    Last Pain:  Vitals:   04/08/22 1209  TempSrc:   PainSc: 1    Pain Goal:                Epidural/Spinal Function Cutaneous sensation: Normal sensation (04/08/22 1200), Patient able to flex knees: Yes (04/08/22 1200), Patient able to lift hips off bed: Yes (04/08/22 1200), Back pain beyond tenderness at insertion site: No (04/08/22 1200), Progressively worsening motor and/or sensory loss: No (04/08/22 1200), Bowel and/or bladder incontinence post epidural: No (04/08/22 1200)  Lynda Rainwater

## 2022-04-08 NOTE — Anesthesia Procedure Notes (Signed)
Procedure Name: Intubation Date/Time: 04/08/2022 10:02 AM  Performed by: Hewitt Blade, CRNAPre-anesthesia Checklist: Patient identified, Emergency Drugs available, Suction available and Patient being monitored Patient Re-evaluated:Patient Re-evaluated prior to induction Oxygen Delivery Method: Circle system utilized Preoxygenation: Pre-oxygenation with 100% oxygen Induction Type: IV induction and Rapid sequence Ventilation: Mask ventilation without difficulty Laryngoscope Size: Mac and 3 Grade View: Grade I Tube type: Oral Tube size: 7.0 mm Number of attempts: 1 Airway Equipment and Method: Stylet Placement Confirmation: ETT inserted through vocal cords under direct vision, positive ETCO2 and breath sounds checked- equal and bilateral Secured at: 20 cm Tube secured with: Tape Dental Injury: Teeth and Oropharynx as per pre-operative assessment

## 2022-04-08 NOTE — Progress Notes (Signed)
Patient desires permanent sterilization.  Other reversible forms of contraception were discussed with patient; she declines all other modalities. Risks of procedure discussed with patient including but not limited to: risk of regret, permanence of method, bleeding, infection, injury to surrounding organs and need for additional procedures.  Failure risk of 1-2 % with increased risk of ectopic gestation if pregnancy occurs was also discussed with patient.  Patient verbalized understanding of these risks and wants to proceed with sterilization.  Written informed consent obtained.  To OR when ready.

## 2022-04-08 NOTE — Lactation Note (Signed)
This note was copied from a baby's chart. Lactation Consultation Note  Patient Name: Elizabeth Bird [id number] Date: 04/08/2022 Reason for consult: Follow-up assessment;Mother's request;Early term 37-38.6wks;Breastfeeding assistance Age:26 years  Mom experienced with breastfeeding states latches non painful. LC noted no signs of nipple trauma.  Mom hands free electric pump and a Haakaa at home.  Mom feeding plan to EBF and once her milk comes in she will use pump and offer bottle. LC reviewed pace bottle feeding and gave her slow flow nipples.   Plan 1. To feed based on cues 8-12x 24hr period. Mom to offer breasts and look for signs of milk transfer.  2. If unable to latch, Mom to offer colostrum on spoon then try a latch.  All questions answered at the end of the visit.    Maternal Data Has patient been taught Hand Expression?: Yes Does the patient have breastfeeding experience prior to this delivery?: Yes How long did the patient breastfeed?: 2 years  Feeding Mother's Current Feeding Choice: Breast Milk  LATCH Score                    Lactation Tools Discussed/Used    Interventions Interventions: Breast feeding basics reviewed;Skin to skin;Hand express;Breast compression;Expressed milk;Education;Pace feeding;Control and instrumentation engineer  Discharge Discharge Education: Engorgement and breast care;Warning signs for feeding baby Pump: Personal;Manual  Consult Status Consult Status: Follow-up Date: 04/09/22 Follow-up type: In-patient    Maryse Brierley  [place] 04/08/2022, 4:32 PM

## 2022-04-08 NOTE — Op Note (Signed)
Elizabeth Bird 04/06/2022 - 04/08/2022  PREOPERATIVE DIAGNOSIS:  Undesired fertility  POSTOPERATIVE DIAGNOSIS:  Undesired fertility  PROCEDURE:  Postpartum Bilateral Tubal Sterilization using the modified Pomeroy method   ANESTHESIA:  Epidural  ASSISTANTS: none  COMPLICATIONS:  None immediate.  ESTIMATED BLOOD LOSS:  Less than 20cc.  FLUIDS: 600 cc LR.  URINE OUTPUT:  150 cc of clear urine.  INDICATIONS: 26 y.o. yo [id number]  with undesired fertility,status post vaginal delivery, desires permanent sterilization. Risks and benefits of procedure discussed with patient including permanence of method, bleeding, infection, injury to surrounding organs and need for additional procedures. Risk failure of 0.5-1% with increased risk of ectopic gestation if pregnancy occurs was also discussed with patient.   FINDINGS:  Normal uterus, tubes, and ovaries.  TECHNIQUE: After informed consent was obtained, the patient was taken to the operating room where general anesthesia was induced (epidural catheter not working, declined spinal). A Foley catheter was inserted to drain the bladder. A small, transverse, umbilical skin incision was made with the scalpel. This incision was carried down to the underlying layer of fascia. The fascia was grasped with Allis clamps, tented up, and entered sharply with Mayo scissors. Underlying peritoneum was then identified, tented up, and entered bluntly. The patient's left fallopian tube was then identified, brought to the incision, and grasped with a Babcock clamp. The tube was then followed out to the fimbria. The Babcock clamp was then used to grasp the tube approximately 4 cm from the cornual region. A 3 cm segment of the tube was then ligated with two free ties of plain gut suture, transected, and excised. Hemostasis was noted and the tube was returned to the abdomen. The right fallopian tube was then identified to its fimbriated end, ligated with two free ties of plain gut  suture, and a 3 cm segment excised in a similar fashion. Hemostasis was noted, and the tube returned to the abdomen. The fascia was re-approximated with 0 Vicryl. The skin was closed in a subcuticular fashion with 3-0 Vicryl. 10 mL of 0.25% percent Marcaine solution was then injected at the incision site. The patient tolerated the procedure well. Sponge, lap, and needle count were correct x2. The patient was taken to recovery room in stable condition.

## 2022-04-08 NOTE — Transfer of Care (Signed)
Immediate Anesthesia Transfer of Care Note  Patient: Elizabeth Bird  Procedure(s) Performed: POST PARTUM TUBAL LIGATION (Bilateral)  Patient Location: PACU  Anesthesia Type:General  Level of Consciousness: awake  Airway & Oxygen Therapy: Patient Spontanous Breathing  Post-op Assessment: Report given to RN and Post -op Vital signs reviewed and stable  Post vital signs: Reviewed and stable  Last Vitals:  Vitals Value Taken Time  BP 108/78 04/08/22 1053  Temp    Pulse 77 04/08/22 1057  Resp 13 04/08/22 1057  SpO2 94 % 04/08/22 1057  Vitals shown include unvalidated device data.  Last Pain:  Vitals:   04/08/22 0910  TempSrc: Oral  PainSc:          Complications: No notable events documented.

## 2022-04-11 LAB — SURGICAL PATHOLOGY

## 2022-04-15 ENCOUNTER — Encounter: Payer: Medicaid Other | Admitting: Advanced Practice Midwife

## 2022-04-16 ENCOUNTER — Telehealth (HOSPITAL_COMMUNITY): Payer: Self-pay

## 2022-04-16 NOTE — Telephone Encounter (Signed)
No Answer. No voicemail.  Sharyn Lull [facility] 06/17//2023,1523

## 2022-04-22 ENCOUNTER — Encounter: Payer: Medicaid Other | Admitting: Obstetrics and Gynecology

## 2022-05-12 ENCOUNTER — Ambulatory Visit: Payer: Medicaid Other | Admitting: Advanced Practice Midwife

## 2023-09-14 ENCOUNTER — Emergency Department (HOSPITAL_COMMUNITY)
Admission: EM | Admit: 2023-09-14 | Discharge: 2023-09-14 | Disposition: A | Discharge status: Home / Self Care | Payer: Medicaid Other | Attending: Emergency Medicine | Admitting: Emergency Medicine

## 2023-09-14 ENCOUNTER — Other Ambulatory Visit: Payer: Self-pay

## 2023-09-14 ENCOUNTER — Encounter (HOSPITAL_COMMUNITY): Payer: Self-pay

## 2023-09-14 DIAGNOSIS — E860 Dehydration: Secondary | ICD-10-CM | POA: Diagnosis present

## 2023-09-14 LAB — COMPREHENSIVE METABOLIC PANEL
ALT: 11 U/L (ref 0–44)
AST: 16 U/L (ref 15–41)
Albumin: 3.9 g/dL (ref 3.5–5.0)
Alkaline Phosphatase: 68 U/L (ref 38–126)
Anion gap: 12 (ref 5–15)
BUN: 21 mg/dL — ABNORMAL HIGH (ref 6–20)
CO2: 24 mmol/L (ref 22–32)
Calcium: 9 mg/dL (ref 8.9–10.3)
Chloride: 105 mmol/L (ref 98–111)
Creatinine, Ser: 0.88 mg/dL (ref 0.44–1.00)
GFR, Estimated: >60 mL/min (ref >60)
Glucose, Bld: 104 mg/dL — ABNORMAL HIGH (ref 70–99)
Potassium: 4.2 mmol/L (ref 3.5–5.1)
Sodium: 141 mmol/L (ref 135–145)
Total Bilirubin: 1 mg/dL (ref <1.2)
Total Protein: 6.9 g/dL (ref 6.5–8.1)

## 2023-09-14 LAB — CBC WITH DIFFERENTIAL/PLATELET
Abs Immature Granulocytes: 0.04 10*3/uL (ref 0.00–0.07)
Basophils Absolute: 0 10*3/uL (ref 0.0–0.1)
Basophils Relative: 0 %
Eosinophils Absolute: 0.1 10*3/uL (ref 0.0–0.5)
Eosinophils Relative: 1 %
HCT: 38.9 % (ref 36.0–46.0)
Hemoglobin: 12.8 g/dL (ref 12.0–15.0)
Immature Granulocytes: 0 %
Lymphocytes Relative: 12 %
Lymphs Abs: 1.7 10*3/uL (ref 0.7–4.0)
MCH: 30 pg (ref 26.0–34.0)
MCHC: 32.9 g/dL (ref 30.0–36.0)
MCV: 91.3 fL (ref 80.0–100.0)
Monocytes Absolute: 0.9 10*3/uL (ref 0.1–1.0)
Monocytes Relative: 6 %
Neutro Abs: 10.9 10*3/uL — ABNORMAL HIGH (ref 1.7–7.7)
Neutrophils Relative %: 81 %
Platelets: 295 10*3/uL (ref 150–400)
RBC: 4.26 MIL/uL (ref 3.87–5.11)
RDW: 13.5 % (ref 11.5–15.5)
WBC: 13.6 10*3/uL — ABNORMAL HIGH (ref 4.0–10.5)
nRBC: 0 % (ref 0.0–0.2)

## 2023-09-14 LAB — URINALYSIS, ROUTINE W REFLEX MICROSCOPIC
Bilirubin Urine: NEGATIVE
Glucose, UA: NEGATIVE mg/dL
Hgb urine dipstick: NEGATIVE
Ketones, ur: 80 mg/dL — AB
Leukocytes,Ua: NEGATIVE
Nitrite: NEGATIVE
Protein, ur: 100 mg/dL — AB
Specific Gravity, Urine: 1.028 (ref 1.005–1.030)
pH: 6 (ref 5.0–8.0)

## 2023-09-14 LAB — HCG, QUANTITATIVE, PREGNANCY: hCG, Beta Chain, Quant, S: <1 m[IU]/mL (ref <5)

## 2023-09-14 MED ORDER — SODIUM CHLORIDE 0.9 % IV BOLUS
1000.0000 mL | Freq: Once | INTRAVENOUS | Status: AC
  Administered 2023-09-14: 1000 mL via INTRAVENOUS

## 2023-09-14 NOTE — ED Notes (Signed)
Received report from [place], pt resting. Nad. Pt took own ibuprofen 800mg  and antibiotic per request and verified by Dr Estell Harpin

## 2023-09-14 NOTE — Discharge Instructions (Signed)
Drink plenty of fluids and follow-up as needed with your md

## 2023-09-14 NOTE — ED Provider Notes (Signed)
[place] [facility]MENT AT [facility] Provider Note   CSN: [id number] Arrival date & time: 09/14/23  4098     History  Chief Complaint  Patient presents with   Weakness    Elizabeth Bird is a 27 y.o. female.  Patient had her top teeth removed yesterday and has felt weak.  Patient has not had much to eat or drink  The history is provided by the patient and medical records. No language interpreter was used.  Weakness Severity:  Mild Onset quality:  Sudden Timing:  Constant Progression:  Worsening Chronicity:  New Context: not alcohol use   Relieved by:  Nothing Worsened by:  Nothing Ineffective treatments:  None tried Associated symptoms: no abdominal pain, no chest pain, no cough, no diarrhea, no frequency, no headaches and no seizures        Home Medications Prior to Admission medications   Medication Sig Start Date End Date Taking? Authorizing Provider  amoxicillin (AMOXIL) 500 MG tablet Take 500 mg by mouth every 8 (eight) hours. 09/13/23  Yes [provider]  chlorhexidine (PERIDEX) 0.12 % solution Use as directed 10 mLs in the mouth or throat 2 (two) times daily. 09/13/23  Yes [provider]  ibuprofen (ADVIL) 800 MG tablet Take 800 mg by mouth every 8 (eight) hours as needed. 09/13/23  Yes [provider]  HYDROcodone-acetaminophen (NORCO/VICODIN) 5-325 MG tablet  09/13/23   [provider]      Allergies    Peroxide Greig Castilla peroxide]    Review of Systems   Review of Systems  Constitutional:  Negative for appetite change and fatigue.  HENT:  Negative for congestion, ear discharge and sinus pressure.   Eyes:  Negative for discharge.  Respiratory:  Negative for cough.   Cardiovascular:  Negative for chest pain.  Gastrointestinal:  Negative for abdominal pain and diarrhea.  Genitourinary:  Negative for frequency and hematuria.  Musculoskeletal:  Negative for back pain.  Skin:  Negative for rash.   Neurological:  Positive for weakness. Negative for seizures and headaches.  Psychiatric/Behavioral:  Negative for hallucinations.     Physical Exam Updated Vital Signs BP 96/60   Pulse 68   Temp 98.2 F (36.8 C) (Oral)   Resp 17   Ht 5\' 4"  (1.626 m)   Wt 45.4 kg   LMP 09/14/2023 (Exact Date)   SpO2 100%   BMI 17.16 kg/m  Physical Exam Vitals and nursing note reviewed.  Constitutional:      Appearance: She is well-developed.  HENT:     Head: Normocephalic.     Comments: Bandage over her upper gum    Nose: Nose normal.  Eyes:     General: No scleral icterus.    Conjunctiva/sclera: Conjunctivae normal.  Neck:     Thyroid: No thyromegaly.  Cardiovascular:     Rate and Rhythm: Normal rate and regular rhythm.     Heart sounds: No murmur heard.    No friction rub. No gallop.  Pulmonary:     Breath sounds: No stridor. No wheezing or rales.  Chest:     Chest wall: No tenderness.  Abdominal:     General: There is no distension.     Tenderness: There is no abdominal tenderness. There is no rebound.  Musculoskeletal:        General: Normal range of motion.     Cervical back: Neck supple.  Lymphadenopathy:     Cervical: No cervical adenopathy.  Skin:    Findings: No  erythema or rash.  Neurological:     Mental Status: She is alert and oriented to person, place, and time.     Motor: No abnormal muscle tone.     Coordination: Coordination normal.  Psychiatric:        Behavior: Behavior normal.     ED Results / Procedures / Treatments   Labs (all labs ordered are listed, but only abnormal results are displayed) Labs Reviewed  CBC WITH DIFFERENTIAL/PLATELET - Abnormal; Notable for the following components:      Result Value   WBC 13.6 (*)    Neutro Abs 10.9 (*)    All other components within normal limits  COMPREHENSIVE METABOLIC PANEL - Abnormal; Notable for the following components:   Glucose, Bld 104 (*)    BUN 21 (*)    All other components within normal  limits  URINALYSIS, ROUTINE W REFLEX MICROSCOPIC - Abnormal; Notable for the following components:   APPearance HAZY (*)    Ketones, ur 80 (*)    Protein, ur 100 (*)    Bacteria, UA RARE (*)    All other components within normal limits  HCG, QUANTITATIVE, PREGNANCY    EKG None  Radiology No results found.  Procedures Procedures    Medications Ordered in ED Medications  sodium chloride 0.9 % bolus 1,000 mL (0 mLs Intravenous Stopped 09/14/23 0843)  sodium chloride 0.9 % bolus 1,000 mL (0 mLs Intravenous Stopped 09/14/23 1133)    ED Course/ Medical Decision Making/ A&P                                 Medical Decision Making Amount and/or Complexity of Data Reviewed Labs: ordered.   Patient with dehydration.  Patient improved with IV fluids and will follow-up as needed        Final Clinical Impression(s) / ED Diagnoses Final diagnoses:  Dehydration    Rx / DC Orders ED Discharge Orders     None         Bethann Berkshire, MD 09/15/23 [id number]

## 2023-09-14 NOTE — ED Triage Notes (Signed)
Pt reports she got all of her top teeth removed yesterday morning.  She states she has been doing okay but then got up to go to the restroom this morning & feels extremely weak.  Reports feeling like she is going to pass out, nausea and generalized weakness.
